# Patient Record
Sex: Female | Born: 1980 | Hispanic: No | Marital: Single | Smoking: Never smoker
Health system: Southern US, Community
[De-identification: ages and names within clinical notes are randomized; demographics above are authoritative.]

## PROBLEM LIST (undated history)

## (undated) DIAGNOSIS — F32A Depression, unspecified: Secondary | ICD-10-CM

## (undated) DIAGNOSIS — E785 Hyperlipidemia, unspecified: Secondary | ICD-10-CM

## (undated) DIAGNOSIS — N390 Urinary tract infection, site not specified: Secondary | ICD-10-CM

## (undated) DIAGNOSIS — F329 Major depressive disorder, single episode, unspecified: Secondary | ICD-10-CM

## (undated) DIAGNOSIS — T7840XA Allergy, unspecified, initial encounter: Secondary | ICD-10-CM

## (undated) HISTORY — DX: Hyperlipidemia, unspecified: E78.5

## (undated) HISTORY — DX: Depression, unspecified: F32.A

## (undated) HISTORY — PX: WISDOM TOOTH EXTRACTION: SHX21

## (undated) HISTORY — PX: EYE SURGERY: SHX253

## (undated) HISTORY — PX: REFRACTIVE SURGERY: SHX103

## (undated) HISTORY — DX: Major depressive disorder, single episode, unspecified: F32.9

## (undated) HISTORY — DX: Allergy, unspecified, initial encounter: T78.40XA

---

## 2005-08-31 ENCOUNTER — Other Ambulatory Visit: Admission: RE | Admit: 2005-08-31 | Discharge: 2005-08-31 | Payer: Self-pay | Admitting: Gynecology

## 2006-01-21 ENCOUNTER — Emergency Department (HOSPITAL_COMMUNITY): Admission: EM | Admit: 2006-01-21 | Discharge: 2006-01-21 | Payer: Self-pay | Admitting: Family Medicine

## 2006-10-03 ENCOUNTER — Other Ambulatory Visit: Admission: RE | Admit: 2006-10-03 | Discharge: 2006-10-03 | Payer: Self-pay | Admitting: Gynecology

## 2011-07-23 ENCOUNTER — Ambulatory Visit (INDEPENDENT_AMBULATORY_CARE_PROVIDER_SITE_OTHER): Payer: 59 | Admitting: Family

## 2011-07-23 ENCOUNTER — Encounter: Payer: Self-pay | Admitting: Family

## 2011-07-23 VITALS — BP 102/60 | Ht 65.0 in | Wt 142.0 lb

## 2011-07-23 DIAGNOSIS — F329 Major depressive disorder, single episode, unspecified: Secondary | ICD-10-CM

## 2011-07-23 DIAGNOSIS — J309 Allergic rhinitis, unspecified: Secondary | ICD-10-CM

## 2011-07-23 DIAGNOSIS — Z3009 Encounter for other general counseling and advice on contraception: Secondary | ICD-10-CM

## 2011-07-23 DIAGNOSIS — L309 Dermatitis, unspecified: Secondary | ICD-10-CM

## 2011-07-23 DIAGNOSIS — L259 Unspecified contact dermatitis, unspecified cause: Secondary | ICD-10-CM

## 2011-07-23 MED ORDER — TRIAMCINOLONE ACETONIDE 0.1 % EX CREA: 1.0000 "application " | TOPICAL_CREAM | Freq: Two times a day (BID) | CUTANEOUS | Status: DC

## 2011-07-23 MED ORDER — SERTRALINE HCL 50 MG PO TABS: 50.0000 mg | ORAL_TABLET | Freq: Every day | ORAL | Status: DC

## 2011-07-23 MED ORDER — TRIAMCINOLONE ACETONIDE 0.1 % EX OINT: 1.0000 "application " | TOPICAL_OINTMENT | Freq: Two times a day (BID) | CUTANEOUS | Status: DC

## 2011-07-23 MED ORDER — MOMETASONE FUROATE 50 MCG/ACT NA SUSP: 2.0000 | Freq: Every day | NASAL | Status: DC

## 2011-07-23 MED ORDER — ETONOGESTREL-ETHINYL ESTRADIOL 0.12-0.015 MG/24HR VA RING: VAGINAL_RING | VAGINAL | Status: DC

## 2011-07-23 NOTE — Patient Instructions (Signed)

## 2011-07-23 NOTE — Progress Notes (Signed)
  Subjective:    Patient ID: Brianna Mckinney, female    DOB: [birth year], 31 y.o.   MRN: [id number]  HPI 31 y/o WF, nonsmoker, new patient to the practice is in to be established. She has a history of depression, allergic rhinitic, and eczema. She is requesting a refill on her OCP. She has no concerns today. She is stable on current meds.    Review of Systems  Constitutional: Negative.   HENT: Negative.   Respiratory: Negative.   Cardiovascular: Negative.   Gastrointestinal: Negative.   Genitourinary: Negative.   Musculoskeletal: Negative.   Skin: Negative.   Neurological: Negative.   Hematological: Negative.   Psychiatric/Behavioral: Negative.    Past Medical History  Diagnosis Date  . Depression   . Allergy   . Hyperlipidemia     History   Social History  . Marital Status: Unknown    Spouse Name: N/A    Number of Children: N/A  . Years of Education: N/A   Occupational History  . Not on file.   Social History Main Topics  . Smoking status: Never Smoker   . Smokeless tobacco: Not on file  . Alcohol Use: Yes  . Drug Use: No  . Sexually Active: Not on file   Other Topics Concern  . Not on file   Social History Narrative  . No narrative on file    Past Surgical History  Procedure Date  . Wisdom tooth extraction   . Refractive surgery     Family History  Problem Relation Age of Onset  . Sudden death Mother     suicide  . Depression Mother   . Arthritis Father     RA    No Known Allergies  No current outpatient prescriptions on file prior to visit.    BP 102/60  Ht 5\' 5"  (1.651 m)  Wt 142 lb (64.411 kg)  BMI 23.63 kg/m2chart    Objective:   Physical Exam  Constitutional: She is oriented to person, place, and time. She appears well-developed and well-nourished.  HENT:  Right Ear: External ear normal.  Left Ear: External ear normal.  Nose: Nose normal.  Mouth/Throat: Oropharynx is clear and moist.  Neck: Normal range of motion. Neck supple.    Cardiovascular: Normal rate, regular rhythm and normal heart sounds.   Pulmonary/Chest: Effort normal and breath sounds normal.  Abdominal: Soft. Bowel sounds are normal.  Musculoskeletal: Normal range of motion.  Neurological: She is alert and oriented to person, place, and time.  Skin: Skin is warm and dry.  Psychiatric: She has a normal mood and affect.          Assessment & Plan:  Assessment: Counseling on Birth Control, Eczema, Depression, Allergic Rhinitis  Plan: Refill meds. Patient will return for a CPX next week. Start Zoloft 1/2 tab po qd x 1 week then increase to 1 tab. Call the office with any questions or concerns prior to next appointment.

## 2011-07-30 ENCOUNTER — Encounter: Payer: Self-pay | Admitting: Family

## 2011-07-30 ENCOUNTER — Ambulatory Visit (INDEPENDENT_AMBULATORY_CARE_PROVIDER_SITE_OTHER): Payer: 59 | Admitting: Family

## 2011-07-30 ENCOUNTER — Other Ambulatory Visit (HOSPITAL_COMMUNITY)
Admission: RE | Admit: 2011-07-30 | Discharge: 2011-07-30 | Disposition: A | Discharge status: Home / Self Care | Payer: 59 | Source: Ambulatory Visit | Attending: Family | Admitting: Family

## 2011-07-30 VITALS — BP 118/60 | Temp 99.0°F | Ht 65.0 in | Wt 141.0 lb

## 2011-07-30 DIAGNOSIS — Z0000 Encounter for general adult medical examination without abnormal findings: Secondary | ICD-10-CM

## 2011-07-30 DIAGNOSIS — Z124 Encounter for screening for malignant neoplasm of cervix: Secondary | ICD-10-CM

## 2011-07-30 DIAGNOSIS — Z01419 Encounter for gynecological examination (general) (routine) without abnormal findings: Secondary | ICD-10-CM | POA: Insufficient documentation

## 2011-07-30 LAB — BASIC METABOLIC PANEL
BUN: 13 mg/dL (ref 6–23)
CO2: 27 mEq/L (ref 19–32)
Chloride: 104 mEq/L (ref 96–112)
GFR: 97.84 mL/min (ref >60.00)
Glucose, Bld: 94 mg/dL (ref 70–99)
Potassium: 4.3 mEq/L (ref 3.5–5.1)
Sodium: 141 mEq/L (ref 135–145)

## 2011-07-30 LAB — CBC WITH DIFFERENTIAL/PLATELET
Basophils Relative: 0.5 % (ref 0.0–3.0)
Eosinophils Relative: 2.1 % (ref 0.0–5.0)
HCT: 38.2 % (ref 36.0–46.0)
Hemoglobin: 13 g/dL (ref 12.0–15.0)
Lymphs Abs: 2.8 10*3/uL (ref 0.7–4.0)
MCV: 96.8 fl (ref 78.0–100.0)
Monocytes Relative: 7.1 % (ref 3.0–12.0)
Platelets: 270 10*3/uL (ref 150.0–400.0)
RBC: 3.95 Mil/uL (ref 3.87–5.11)
WBC: 6.5 10*3/uL (ref 4.5–10.5)

## 2011-07-30 LAB — LDL CHOLESTEROL, DIRECT: Direct LDL: 137.5 mg/dL

## 2011-07-30 LAB — LIPID PANEL
HDL: 81.4 mg/dL (ref >39.00)
Total CHOL/HDL Ratio: 3
VLDL: 13.6 mg/dL (ref 0.0–40.0)

## 2011-07-30 NOTE — Progress Notes (Signed)
Subjective:    Patient ID: Brianna Mckinney, female    DOB: 1980-10-30, 31 y.o.   MRN: [id number]  HPI  This is a routine physical examination for this healthy  Female. Reviewed all health maintenance protocols  reviewed appropriate screening labs. Her immunization history was reviewed as well as her current medications and allergies refills of her chronic medications were given and the plan for yearly health maintenance was discussed all orders and referrals were made as appropriate.   Review of Systems  All other systems reviewed and are negative.   Past Medical History  Diagnosis Date  . Depression   . Allergy   . Hyperlipidemia     History   Social History  . Marital Status: Unknown    Spouse Name: N/A    Number of Children: N/A  . Years of Education: N/A   Occupational History  . Not on file.   Social History Main Topics  . Smoking status: Never Smoker   . Smokeless tobacco: Not on file  . Alcohol Use: Yes  . Drug Use: No  . Sexually Active: Not on file   Other Topics Concern  . Not on file   Social History Narrative  . No narrative on file    Past Surgical History  Procedure Date  . Wisdom tooth extraction   . Refractive surgery     Family History  Problem Relation Age of Onset  . Sudden death Mother     suicide  . Depression Mother   . Arthritis Father     RA    No Known Allergies  Current Outpatient Prescriptions on File Prior to Visit  Medication Sig Dispense Refill  . etonogestrel-ethinyl estradiol (NUVARING) 0.12-0.015 MG/24HR vaginal ring Use as directed  3 each  4  . mometasone (NASONEX) 50 MCG/ACT nasal spray Place 2 sprays into the nose daily.  17 g  3  . sertraline (ZOLOFT) 50 MG tablet Take 1 tablet (50 mg total) by mouth daily.  30 tablet  3  . triamcinolone cream (KENALOG) 0.1 % Apply 1 application topically 2 (two) times daily.  30 g  0  . triamcinolone ointment (KENALOG) 0.1 % Apply 1 application topically 2 (two) times daily.  30 g   0    BP 118/60  Temp(Src) 99 F (37.2 C) (Oral)  Ht 5\' 5"  (1.651 m)  Wt 141 lb (63.957 kg)  BMI 23.46 kg/m2chart    Objective:   Physical Exam  Constitutional: She is oriented to person, place, and time. She appears well-developed and well-nourished.  HENT:  Head: Normocephalic and atraumatic.  Right Ear: External ear normal.  Left Ear: External ear normal.  Nose: Nose normal.  Mouth/Throat: Oropharynx is clear and moist.  Eyes: Conjunctivae are normal. Pupils are equal, round, and reactive to light.  Neck: Normal range of motion.  Cardiovascular: Normal rate and normal heart sounds.   Pulmonary/Chest: Effort normal and breath sounds normal.  Abdominal: Soft. Bowel sounds are normal.  Genitourinary: Vagina normal and uterus normal.  Musculoskeletal: Normal range of motion.  Neurological: She is alert and oriented to person, place, and time. She has normal reflexes.  Skin: Skin is warm and dry.  Psychiatric: She has a normal mood and affect.          Assessment & Plan:  Assessment: Complete physical exam with Pap smear  Plan: Pap smear sent. Other labs sent to include BMP, CBC, TSH and lipids Will notify patient of the results. Encouraged him to  diet, exercise, self breast exams, hand gun safety. We'll follow the patient and the results of her labs, in one year or when necessary.

## 2011-07-30 NOTE — Patient Instructions (Signed)
Exercise to Stay Healthy Exercise helps you become and stay healthy. EXERCISE IDEAS AND TIPS Choose exercises that:  You enjoy.   Fit into your day.  You do not need to exercise really hard to be healthy. You can do exercises at a slow or medium level and stay healthy. You can:  Stretch before and after working out.   Try yoga, Pilates, or tai chi.   Lift weights.   Walk fast, swim, jog, run, climb stairs, bicycle, dance, or rollerskate.   Take aerobic classes.  Exercises that burn about 150 calories:  Running 1  miles in 15 minutes.   Playing volleyball for 45 to 60 minutes.   Washing and waxing a car for 45 to 60 minutes.   Playing touch football for 45 minutes.   Walking 1  miles in 35 minutes.   Pushing a stroller 1  miles in 30 minutes.   Playing basketball for 30 minutes.   Raking leaves for 30 minutes.   Bicycling 5 miles in 30 minutes.   Walking 2 miles in 30 minutes.   Dancing for 30 minutes.   Shoveling snow for 15 minutes.   Swimming laps for 20 minutes.   Walking up stairs for 15 minutes.   Bicycling 4 miles in 15 minutes.   Gardening for 30 to 45 minutes.   Jumping rope for 15 minutes.   Washing windows or floors for 45 to 60 minutes.  Document Released: 06/05/2010 Document Revised: 04/22/2011 Document Reviewed: 06/05/2010 [facility] Patient Information 2012 [place], [place].  Breast Self-Exam A self breast exam may help you find changes or problems while they are still small. Do a breast self-exam:  Every month.   One week after your period (menstrual period).   On the first day of each month if you do not have periods anymore.  Look for any:  Change in breast color, size, or shape.   Dimples in your breast.   Changes in your nipples or skin.   Dry skin on your breasts or nipples.   Watery or bloody discharge from your nipples.   Feel for:  Lumps.   Thick, hard places.   Any other changes.  HOME CARE There are 3 ways  to do the breast self-exam: In front of a mirror.  Lift your arms over your head and turn side to side.   Put your hands on your hips and lean down, then turn from side to side.   Bend forward and turn from side to side.  In the shower.  With soapy hands, check both breasts. Then check above and below your collarbone and your armpits.   Feel above and below your collarbone down to under your breast, and from the center of your chest to the outer edge of the armpit. Check for any lumps or hard spots.   Using the tips of your middle three fingers check your whole breast by pressing your hand over your breast in a circle or in an up and down motion.  Lying down.  Lie flat on your bed.   Put a small pillow under the breast you are going to check. On that same side, put your hand behind your head.   With your other hand, use the 3 middle fingers to feel the breast.   Move your fingers in a circle around the breast. Press firmly over all parts of the breast to feel for any lumps.  GET HELP RIGHT AWAY IF: You find any changes in  your breasts so they can be checked. Document Released: 10/20/2007 Document Revised: 04/22/2011 Document Reviewed: 08/21/2008 [facility] Patient Information 2012 [place], [place].

## 2011-12-14 ENCOUNTER — Other Ambulatory Visit: Payer: Self-pay | Admitting: Family

## 2012-01-12 ENCOUNTER — Other Ambulatory Visit: Payer: Self-pay | Admitting: Family

## 2012-02-17 ENCOUNTER — Other Ambulatory Visit: Payer: Self-pay | Admitting: Family

## 2012-04-01 ENCOUNTER — Encounter (HOSPITAL_COMMUNITY): Payer: Self-pay | Admitting: Emergency Medicine

## 2012-04-01 ENCOUNTER — Emergency Department (HOSPITAL_COMMUNITY)
Admission: EM | Admit: 2012-04-01 | Discharge: 2012-04-01 | Disposition: A | Discharge status: Home / Self Care | Payer: 59 | Source: Home / Self Care

## 2012-04-01 DIAGNOSIS — J069 Acute upper respiratory infection, unspecified: Secondary | ICD-10-CM

## 2012-04-01 DIAGNOSIS — R0982 Postnasal drip: Secondary | ICD-10-CM

## 2012-04-01 NOTE — ED Notes (Addendum)
Per information sheet prepared by patient.  Chest congestion, upper respiratory infection.  Symptoms for one week

## 2012-04-01 NOTE — ED Provider Notes (Signed)
History     CSN: [id number]  Arrival date & time 04/01/12  1144   None     No chief complaint on file.   (Consider location/radiation/quality/duration/timing/severity/associated sxs/prior treatment) HPI Comments: 31 year old pleasant female with "respiratory symptoms" for approximately one week. Started out as a cough with yellow mucus. She states that most the nasal congestion has improved and she just has postpharyngeal drainage. She denies fever, earache or sore throat. She has the sensation or perception of congestion in her upper chest.   Past Medical History  Diagnosis Date  . Depression   . Allergy   . Hyperlipidemia     Past Surgical History  Procedure Date  . Wisdom tooth extraction   . Refractive surgery     Family History  Problem Relation Age of Onset  . Sudden death Mother     suicide  . Depression Mother   . Arthritis Father     RA    History  Substance Use Topics  . Smoking status: Never Smoker   . Smokeless tobacco: Not on file  . Alcohol Use: Yes    OB History    Grav Para Term Preterm Abortions TAB SAB Ect Mult Living                  Review of Systems  Constitutional: Negative for fever, chills, activity change, appetite change and fatigue.  HENT: Positive for rhinorrhea and postnasal drip. Negative for congestion, sore throat, facial swelling, neck pain and neck stiffness.   Eyes: Negative.   Respiratory: Negative.  Negative for cough, chest tightness, shortness of breath and wheezing.   Cardiovascular: Negative.   Gastrointestinal: Negative.   Skin: Negative for pallor and rash.  Neurological: Negative.   Psychiatric/Behavioral: Negative.     Allergies  Review of patient's allergies indicates no known allergies.  Home Medications   Current Outpatient Rx  Name  Route  Sig  Dispense  Refill  . ETONOGESTREL-ETHINYL ESTRADIOL 0.12-0.015 MG/24HR VA RING      Use as directed   3 each   4   . NASONEX 50 MCG/ACT NA SUSP     INSTILL 2 SPRAYS INTO THE NOSE DAILY.   1 Inhaler   5   . SERTRALINE HCL 50 MG PO TABS      TAKE 1 TABLET BY MOUTH DAILY.   30 tablet   6   . TRIAMCINOLONE ACETONIDE 0.1 % EX CREA   Topical   Apply 1 application topically 2 (two) times daily.   30 g   0   . TRIAMCINOLONE ACETONIDE 0.1 % EX OINT   Topical   Apply 1 application topically 2 (two) times daily.   30 g   0     There were no vitals taken for this visit.  Physical Exam  Nursing note and vitals reviewed. Constitutional: She is oriented to person, place, and time. She appears well-developed and well-nourished. No distress.  HENT:       TMs are normal. Oropharynx with mild patchy erythema and streaking in the posterior oropharynx and early cobblestoning. No edema, exudates or swelling.  Eyes: EOM are normal. No scleral icterus.  Neck: Normal range of motion. Neck supple.  Cardiovascular: Normal rate and regular rhythm.   Pulmonary/Chest: Effort normal and breath sounds normal. No respiratory distress. She has no wheezes. She has no rales.  Musculoskeletal: Normal range of motion. She exhibits no edema.  Lymphadenopathy:    She has no cervical adenopathy.  Neurological: She  is alert and oriented to person, place, and time.  Skin: Skin is warm and dry. No rash noted.  Psychiatric: She has a normal mood and affect.    ED Course  Procedures (including critical care time)  Labs Reviewed - No data to display No results found.   1. URI (upper respiratory infection)   2. PND (post-nasal drip)       MDM  This is most likely the end of a URI. She still has PND which is causing her current symptoms. There are no signs of bacterial type infection, and she appears generally well. Her exam is essentially normal with exception of PND and cobblestoning in the posterior pharynx. She may continue to take her Allegra daily and if needed to increase the efficacy of the antihistamine take Chlor-Trimeton 4 mg one half  tablet  every 4 hours while at home. She may also continue to use her intranasal steroids. She was reassured that her lungs and airway are clear no adventitious sounds such as wheezing or rales. Return for any new symptoms problems or worsening 1345 the patient was still waiting for her instructions. I gave them to her verbally and since she had been waiting for so long after I had discharged her I went ahead and gave her written instructions as well so I do not think she actually signed anything before she left but she did receive her written instructions.            Hayden Rasmussen, NP 04/01/12 1317  Hayden Rasmussen, NP 04/01/12 1350  Hayden Rasmussen, NP 04/01/12 2131

## 2012-04-01 NOTE — ED Notes (Signed)
Brianna Hua np reports patient left with instructions.

## 2012-04-01 NOTE — ED Provider Notes (Signed)
Medical screening examination/treatment/procedure(s) were performed by non-physician practitioner and as supervising physician I was immediately available for consultation/collaboration.  Calani Gick, M.D.   Marinna Blane C Dajion Bickford, MD 04/01/12 2219 

## 2012-08-02 ENCOUNTER — Ambulatory Visit: Payer: 59 | Admitting: Family

## 2012-09-07 ENCOUNTER — Other Ambulatory Visit: Payer: Self-pay | Admitting: Family

## 2012-10-06 ENCOUNTER — Other Ambulatory Visit: Payer: Self-pay | Admitting: Family

## 2012-10-13 ENCOUNTER — Telehealth: Payer: Self-pay | Admitting: Family

## 2012-10-13 MED ORDER — MOMETASONE FUROATE 50 MCG/ACT NA SUSP: NASAL | Status: DC

## 2012-10-13 NOTE — Telephone Encounter (Signed)
NO FURTHER REFILLS FOR PT WITHOUT AN OFFICE VISIT

## 2012-10-13 NOTE — Telephone Encounter (Signed)
Pharmacy states they have tried to request a refill on pt's NASONEX twice this wk with no results. I see nothing in EPIC. Please send refill of NASONEX to [facility].

## 2012-10-25 ENCOUNTER — Ambulatory Visit (INDEPENDENT_AMBULATORY_CARE_PROVIDER_SITE_OTHER): Payer: 59 | Admitting: Family

## 2012-10-25 ENCOUNTER — Encounter: Payer: Self-pay | Admitting: Family

## 2012-10-25 ENCOUNTER — Other Ambulatory Visit (HOSPITAL_COMMUNITY)
Admission: RE | Admit: 2012-10-25 | Discharge: 2012-10-25 | Disposition: A | Discharge status: Home / Self Care | Payer: 59 | Source: Ambulatory Visit | Attending: Family | Admitting: Family

## 2012-10-25 VITALS — BP 94/60 | HR 80 | Ht 65.0 in | Wt 130.5 lb

## 2012-10-25 DIAGNOSIS — Z01419 Encounter for gynecological examination (general) (routine) without abnormal findings: Secondary | ICD-10-CM | POA: Insufficient documentation

## 2012-10-25 DIAGNOSIS — Z124 Encounter for screening for malignant neoplasm of cervix: Secondary | ICD-10-CM

## 2012-10-25 DIAGNOSIS — Z23 Encounter for immunization: Secondary | ICD-10-CM

## 2012-10-25 DIAGNOSIS — Z0000 Encounter for general adult medical examination without abnormal findings: Secondary | ICD-10-CM

## 2012-10-25 LAB — CBC WITH DIFFERENTIAL/PLATELET
Basophils Relative: 0.4 % (ref 0.0–3.0)
Hemoglobin: 12.9 g/dL (ref 12.0–15.0)
Lymphocytes Relative: 38.6 % (ref 12.0–46.0)
Monocytes Relative: 7.8 % (ref 3.0–12.0)
Neutro Abs: 4.4 10*3/uL (ref 1.4–7.7)
RBC: 3.87 Mil/uL (ref 3.87–5.11)
WBC: 8.8 10*3/uL (ref 4.5–10.5)

## 2012-10-25 LAB — COMPREHENSIVE METABOLIC PANEL
ALT: 15 U/L (ref 0–35)
Albumin: 4.1 g/dL (ref 3.5–5.2)
BUN: 13 mg/dL (ref 6–23)
CO2: 25 mEq/L (ref 19–32)
Calcium: 9.6 mg/dL (ref 8.4–10.5)
Chloride: 102 mEq/L (ref 96–112)
Creatinine, Ser: 0.6 mg/dL (ref 0.4–1.2)
GFR: 115.1 mL/min (ref >60.00)
Potassium: 4.1 mEq/L (ref 3.5–5.1)

## 2012-10-25 LAB — POCT URINALYSIS DIPSTICK
Bilirubin, UA: NEGATIVE
Blood, UA: NEGATIVE
Glucose, UA: NEGATIVE
Nitrite, UA: NEGATIVE
Urobilinogen, UA: 0.2

## 2012-10-25 LAB — LIPID PANEL
HDL: 88.2 mg/dL (ref >39.00)
Triglycerides: 55 mg/dL (ref 0.0–149.0)

## 2012-10-25 LAB — LDL CHOLESTEROL, DIRECT: Direct LDL: 169.1 mg/dL

## 2012-10-25 LAB — POCT URINE PREGNANCY: Preg Test, Ur: NEGATIVE

## 2012-10-25 NOTE — Progress Notes (Signed)
Subjective:    Patient ID: Brianna Mckinney, female    DOB: 1981-01-16, 32 y.o.   MRN: [id number]  HPI 32 year old white female presents to PCP for annual physical exam. Pt states she feels generally in "good health", reports a cyclic pattern of diarrhea alternating with constipation x several months with mild stomach cramping; denies any aggravating factors and states any abd pain is relieved once having a bowel movement. States LMP 10/18/12   Review of Systems  Constitutional: Negative.   HENT: Negative.   Eyes: Negative.   Respiratory: Negative.   Cardiovascular: Negative.   Gastrointestinal: Positive for diarrhea and constipation.  Endocrine: Negative.   Genitourinary: Negative.   Musculoskeletal: Negative.   Skin: Negative.   Allergic/Immunologic: Negative.   Neurological: Negative.   Hematological: Negative.   Psychiatric/Behavioral: Negative.        Past Medical History  Diagnosis Date  . Depression   . Allergy   . Hyperlipidemia     History   Social History  . Marital Status: Single    Spouse Name: N/A    Number of Children: N/A  . Years of Education: N/A   Occupational History  . Not on file.   Social History Main Topics  . Smoking status: Never Smoker   . Smokeless tobacco: Not on file  . Alcohol Use: Yes  . Drug Use: No  . Sexually Active: Yes    Birth Control/ Protection: Other-see comments   Other Topics Concern  . Not on file   Social History Narrative  . No narrative on file    Past Surgical History  Procedure Laterality Date  . Wisdom tooth extraction    . Refractive surgery      Family History  Problem Relation Age of Onset  . Sudden death Mother     suicide  . Depression Mother   . Arthritis Father     RA    No Known Allergies  Current Outpatient Prescriptions on File Prior to Visit  Medication Sig Dispense Refill  . mometasone (NASONEX) 50 MCG/ACT nasal spray INSTILL 2 SPRAYS INTO THE NOSE DAILY.  17 g  0  . NUVARING  0.12-0.015 MG/24HR vaginal ring USE AS DIRECTED  3 each  0  . sertraline (ZOLOFT) 50 MG tablet TAKE 1 TABLET BY MOUTH DAILY.  30 tablet  6  . triamcinolone cream (KENALOG) 0.1 % Apply 1 application topically 2 (two) times daily.  30 g  0  . triamcinolone ointment (KENALOG) 0.1 % Apply 1 application topically 2 (two) times daily.  30 g  0   No current facility-administered medications on file prior to visit.    BP 94/60  Pulse 80  Ht 5\' 5"  (1.651 m)  Wt 130 lb 8 oz (59.194 kg)  BMI 21.72 kg/m2  SpO2 98%chart Objective:   Physical Exam  Constitutional: She is oriented to person, place, and time. She appears well-developed and well-nourished.  HENT:  Head: Normocephalic and atraumatic.  Eyes: Pupils are equal, round, and reactive to light.  Neck: Normal range of motion.  Cardiovascular: Normal rate and regular rhythm.   Pulmonary/Chest: Effort normal.  Abdominal: Soft.  Genitourinary: Vagina normal and uterus normal.  Musculoskeletal: Normal range of motion.  Neurological: She is alert and oriented to person, place, and time.  Skin: Skin is warm and dry.          Assessment & Plan:  1.Complete Physical exam with pap and pelvic  Annual breast and pelvic/pap exam performed. Pt  educated on monthly self breast exam. Tetanus shot will be updated on this visit. Instructed to return to PCP for any acute issues.

## 2012-10-25 NOTE — Patient Instructions (Addendum)
Breast Self-Examination You should begin examining your breasts at age 34 even though the risk for breast cancer is low in this age group. It is important to become familiar with how your breasts look and feel. This is true for pregnant women, nursing mothers, women in menopause and women who have breast implants.  Women should examine their breasts once a month to look for changes and lumps. By doing monthly breast exams, you get to know how your breasts feel and how they can change from month to month. This allows you to pick up changes early. It can also offer you some reassurance that your breast health is good. This exam only takes minutes. Most breast lumps are not caused by cancer. If you find a lump, a special x-ray called a mammogram, or other tests may be needed to determine what is wrong.  Some of the signs that a breast lump is caused by cancer include:  Dimpling of the skin or changes in the shape of the breast or nipple.   A dark-colored or bloody discharge from the nipple.   Swollen lymph glands around the breast or in the armpit.   Redness of the breast or nipple.   Scaly nipple or skin on the breast.   Pain or swelling of the breast.  SELF-EXAM There are a few points to follow when doing a thorough breast exam. The best time to examine your breasts is 5 to 7 days after the menstrual period is over. During menstruation, the breasts are lumpier, and it may be more difficult to pick up changes. If you do not menstruate, have reached menopause or had a hysterectomy, examine your breasts the first day of every month. After three to four months, you will become more familiar with the variations of your breasts and more comfortable with the exam.  Perform your breast exam monthly. Keep a written record with breast changes or normal findings for each breast. This makes it easier to be sure of changes and to not solely depend on memory for size, tenderness, or location. Try to do the exam  at the same time each month, and write down where you are in your menstrual cycle if you are still menstruating.   Look at your breasts. Stand in front of a mirror with your hands clasped behind your head. Tighten your chest muscles and look for asymmetry. This means a difference in shape or contour from one breast to the other, such as puckers, dips or bumps. Look also for skin changes.   Lean forward with your hands on your hips. Again, look for symmetry and skin changes.   While showering, soap the breasts, and carefully feel the breasts with fingertips while holding the arm (on the side of the breast being examined) over the head. Do this with each breast carefully feeling for lumps or changes. Typically, a circular motion with moderate fingertip pressure should be used.   Repeat this exam while lying on your back, again with your arm behind your head and a pillow under your shoulders. Again, use your fingertips to examine both breasts, feeling for lumps and thickening. Begin at 1 o'clock and go clockwise around the whole breast.   At the end of your exam, gently squeeze each nipple to see if there is any drainage. Look for nipple changes, dimpling or redness.   Lastly, examine the upper chest and clavicle areas and in your armpits.  It is not necessary to become alarmed if you find  a breast lump. Most of them are not cancerous. However, it is necessary to see your caregiver if a lump is found in order to have it looked at. Document Released: 06/10/2004 Document Revised: 01/13/2011 Document Reviewed: 08/20/2008 [facility] Patient Information 2012 [place], [place].  Irritable Bowel Syndrome Irritable Bowel Syndrome (IBS) is caused by a disturbance of normal bowel function. Other terms used are spastic colon, mucous colitis, and irritable colon. It does not require surgery, nor does it lead to cancer. There is no cure for IBS. But with proper diet, stress reduction, and medication, you will find  that your problems (symptoms) will gradually disappear or improve. IBS is a common digestive disorder. It usually appears in late adolescence or early adulthood. Women develop it twice as often as men. CAUSES  After food has been digested and absorbed in the small intestine, waste material is moved into the colon (large intestine). In the colon, water and salts are absorbed from the undigested products coming from the small intestine. The remaining residue, or fecal material, is held for elimination. Under normal circumstances, gentle, rhythmic contractions on the bowel walls push the fecal material along the colon towards the rectum. In IBS, however, these contractions are irregular and poorly coordinated. The fecal material is either retained too long, resulting in constipation, or expelled too soon, producing diarrhea. SYMPTOMS  The most common symptom of IBS is pain. It is typically in the lower left side of the belly (abdomen). But it may occur anywhere in the abdomen. It can be felt as heartburn, backache, or even as a dull pain in the arms or shoulders. The pain comes from excessive bowel-muscle spasms and from the buildup of gas and fecal material in the colon. This pain:  Can range from sharp belly (abdominal) cramps to a dull, continuous ache.  Usually worsens soon after eating.  Is typically relieved by having a bowel movement or passing gas. Abdominal pain is usually accompanied by constipation. But it may also produce diarrhea. The diarrhea typically occurs right after a meal or upon arising in the morning. The stools are typically soft and watery. They are often flecked with secretions (mucus). Other symptoms of IBS include:  Bloating.  Loss of appetite.  Heartburn.  Feeling sick to your stomach (nausea).  Belching  Vomiting  Gas. IBS may also cause a number of symptoms that are unrelated to the digestive system:  Fatigue.  Headaches.  Anxiety  Shortness of  breath  Difficulty in concentrating.  Dizziness. These symptoms tend to come and go. DIAGNOSIS  The symptoms of IBS closely mimic the symptoms of other, more serious digestive disorders. So your caregiver may wish to perform a variety of additional tests to exclude these disorders. He/she wants to be certain of learning what is wrong (diagnosis). The nature and purpose of each test will be explained to you. TREATMENT A number of medications are available to help correct bowel function and/or relieve bowel spasms and abdominal pain. Among the drugs available are:  Mild, non-irritating laxatives for severe constipation and to help restore normal bowel habits.  Specific anti-diarrheal medications to treat severe or prolonged diarrhea.  Anti-spasmodic agents to relieve intestinal cramps.  Your caregiver may also decide to treat you with a mild tranquilizer or sedative during unusually stressful periods in your life. The important thing to remember is that if any drug is prescribed for you, make sure that you take it exactly as directed. Make sure that your caregiver knows how well it worked  for you. HOME CARE INSTRUCTIONS   Avoid foods that are high in fat or oils. Some examples [id number]:[id number] cream, butter, frankfurters, sausage, and other fatty meats.  Avoid foods that have a laxative effect, such as fruit, fruit juice, and dairy products.  Cut out carbonated drinks, chewing gum, and "gassy" foods, such as beans and cabbage. This may help relieve bloating and belching.  Bran taken with plenty of liquids may help relieve constipation.  Keep track of what foods seem to trigger your symptoms.  Avoid emotionally charged situations or circumstances that produce anxiety.  Start or continue exercising.  Get plenty of rest and sleep. MAKE SURE YOU:   Understand these instructions.  Will watch your condition.  Will get help right away if you are not doing well or get worse. Document  Released: 05/03/2005 Document Revised: 07/26/2011 Document Reviewed: 12/22/2007 [facility] Patient Information 2014 [place], [place].

## 2012-10-25 NOTE — Addendum Note (Signed)
Addended by: Beverely Low on: 10/25/2012 10:19 AM   Modules accepted: Orders

## 2012-10-26 ENCOUNTER — Other Ambulatory Visit: Payer: Self-pay | Admitting: Family

## 2012-10-26 MED ORDER — ATORVASTATIN CALCIUM 10 MG PO TABS: 10.0000 mg | ORAL_TABLET | Freq: Every day | ORAL | Status: DC

## 2012-10-27 ENCOUNTER — Other Ambulatory Visit: Payer: Self-pay

## 2012-10-27 MED ORDER — TRIAMCINOLONE ACETONIDE 0.1 % EX CREA: 1.0000 "application " | TOPICAL_CREAM | Freq: Two times a day (BID) | CUTANEOUS | Status: DC

## 2012-10-27 MED ORDER — TRIAMCINOLONE ACETONIDE 0.1 % EX OINT: 1.0000 "application " | TOPICAL_OINTMENT | Freq: Two times a day (BID) | CUTANEOUS | Status: DC

## 2012-10-27 MED ORDER — ETONOGESTREL-ETHINYL ESTRADIOL 0.12-0.015 MG/24HR VA RING: VAGINAL_RING | VAGINAL | Status: DC

## 2012-10-27 MED ORDER — MOMETASONE FUROATE 50 MCG/ACT NA SUSP: NASAL | Status: DC

## 2012-12-02 ENCOUNTER — Emergency Department (HOSPITAL_COMMUNITY)
Admission: EM | Admit: 2012-12-02 | Discharge: 2012-12-02 | Disposition: A | Discharge status: Home / Self Care | Payer: 59 | Source: Home / Self Care | Attending: Emergency Medicine | Admitting: Emergency Medicine

## 2012-12-02 ENCOUNTER — Encounter (HOSPITAL_COMMUNITY): Payer: Self-pay | Admitting: Emergency Medicine

## 2012-12-02 DIAGNOSIS — N3000 Acute cystitis without hematuria: Secondary | ICD-10-CM

## 2012-12-02 LAB — POCT URINALYSIS DIP (DEVICE)
Bilirubin Urine: NEGATIVE
Glucose, UA: NEGATIVE mg/dL
Ketones, ur: NEGATIVE mg/dL
pH: 6 (ref 5.0–8.0)

## 2012-12-02 LAB — POCT PREGNANCY, URINE: Preg Test, Ur: NEGATIVE

## 2012-12-02 MED ORDER — CEPHALEXIN 500 MG PO CAPS: 500.0000 mg | ORAL_CAPSULE | Freq: Three times a day (TID) | ORAL | Status: DC

## 2012-12-02 MED ORDER — PHENAZOPYRIDINE HCL 200 MG PO TABS: 200.0000 mg | ORAL_TABLET | Freq: Three times a day (TID) | ORAL | Status: DC | PRN

## 2012-12-02 NOTE — ED Notes (Signed)
Pt reports uti sx that started last night.  Urinary urgency, frequency, bladder spasms.  hematuria, and dysuria. Strong urine odor.  Pt has not used any otc meds for treatment.

## 2012-12-02 NOTE — ED Provider Notes (Signed)
Chief Complaint:   Chief Complaint  Patient presents with  . Urinary Tract Infection    onset last night. hematuria. bladder spasms. urgency frequency and odor.     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 She describes bladder spasms, urinary frequency, urgency, pain with urination, blood in the urine, urinary odor, and suprapubic pain. She denies fever, chills, nausea, vomiting, lower back pain, or GYN symptoms she had a urinary tract infection 3 years ago with similar symptoms.  Review of Systems:  Other than noted above, the patient denies any of the following symptoms: General:  No fevers, chills, sweats, aches, or fatigue. GI:  No abdominal pain, back pain, nausea, vomiting, diarrhea, or constipation. GU:  No dysuria, frequency, urgency, hematuria, or incontinence. GYN:  No discharge, itching, vulvar pain or lesions, pelvic pain, or abnormal vaginal bleeding.  PMFSH:  Past medical history, family history, social history, meds, and allergies were reviewed.  She takes Lipitor for elevated cholesterol, sertraline for depression and has a history of allergies. She has a NuvaRing in place.  Physical Exam:   Vital signs:  BP 126/78  Pulse 65  Resp 14  SpO2 100%  LMP 11/15/2012 Gen:  Alert, oriented, in no distress. Lungs:  Clear to auscultation, no wheezes, rales or rhonchi. Heart:  Regular rhythm, no gallop or murmer. Abdomen:  Flat and soft. There was slight suprapubic pain to palpation.  No guarding, or rebound.  No hepato-splenomegaly or mass.  Bowel sounds were normally active. Back:  No CVA tenderness.  Skin:  Clear, warm and dry.  Labs:    Results for orders placed during the hospital encounter of 12/02/12  POCT URINALYSIS DIP (DEVICE)      Result Value Range   Glucose, UA NEGATIVE  NEGATIVE mg/dL   Bilirubin Urine NEGATIVE  NEGATIVE   Ketones, ur NEGATIVE  NEGATIVE mg/dL   Specific  Gravity, Urine >=1.030  1.005 - 1.030   Hgb urine dipstick LARGE (*) NEGATIVE   pH 6.0  5.0 - 8.0   Protein, ur 100 (*) NEGATIVE mg/dL   Urobilinogen, UA 1.0  0.0 - 1.0 mg/dL   Nitrite NEGATIVE  NEGATIVE   Leukocytes, UA MODERATE (*) NEGATIVE  POCT PREGNANCY, URINE      Result Value Range   Preg Test, Ur NEGATIVE  NEGATIVE     A urine culture was obtained.  Results are pending at this time and we will call about any positive results.  Assessment: The encounter diagnosis was Acute cystitis.   No evidence of pyelonephritis.  Plan:   1.  The following meds were prescribed:   Discharge Medication List as of 12/02/2012  6:38 PM    START taking these medications   Details  cephALEXin (KEFLEX) 500 MG capsule Take 1 capsule (500 mg total) by mouth 3 (three) times daily., Starting 12/02/2012, Until Discontinued, Normal    phenazopyridine (PYRIDIUM) 200 MG tablet Take 1 tablet (200 mg total) by mouth 3 (three) times daily as needed for pain., Starting 12/02/2012, Until Discontinued, Normal       2.  The patient was instructed in symptomatic care and handouts were given. 3.  The patient was told to return if becoming worse in any way, if no better in 3 or 4 days, and given some red flag symptoms such as fever, vomiting, or increasing pain that would indicate earlier return. 4.  The patient was told to  avoid intercourse for 10 days, get extra fluids, and return for a follow up with her primary care doctor at the completion of treatment for a repeat UA and culture.     Reuben Likes, MD 12/02/12 [phone]

## 2012-12-05 LAB — URINE CULTURE

## 2012-12-05 NOTE — ED Notes (Signed)
Urine culture: >100,000 colonies E. Coli.   Pt. adequately treated with Keflex. Brianna Mckinney M 12/05/2012  

## 2012-12-22 ENCOUNTER — Telehealth: Payer: Self-pay | Admitting: Family

## 2012-12-22 ENCOUNTER — Ambulatory Visit (INDEPENDENT_AMBULATORY_CARE_PROVIDER_SITE_OTHER): Payer: 59 | Admitting: Family Medicine

## 2012-12-22 VITALS — BP 96/64 | Temp 98.2°F | Wt 126.0 lb

## 2012-12-22 DIAGNOSIS — N390 Urinary tract infection, site not specified: Secondary | ICD-10-CM

## 2012-12-22 DIAGNOSIS — R300 Dysuria: Secondary | ICD-10-CM

## 2012-12-22 LAB — POCT URINALYSIS DIPSTICK
Glucose, UA: NEGATIVE
Ketones, UA: NEGATIVE
Spec Grav, UA: 1.025

## 2012-12-22 MED ORDER — CIPROFLOXACIN HCL 500 MG PO TABS: 500.0000 mg | ORAL_TABLET | Freq: Two times a day (BID) | ORAL | Status: DC

## 2012-12-22 NOTE — Telephone Encounter (Signed)
Rx sent to pharmacy   

## 2012-12-22 NOTE — Progress Notes (Signed)
Chief Complaint  Patient presents with  . Dysuria    HPI:  Brianna Mckinney, a 32 yo F pt of [facility]ango is here for an acute visit for dysuria, ? UTI: -had UTI confirmed on culture a few weeks ago tx with keflex - felt better then started having dysuria last night -symptoms: dysuria, spasm with urinary, frequency, urgency -denies: fevers, vomiting, nausea, malaise, hematuria, flank pain - very mild low back pain -hx of several UTIs in the past -FDLMP 1 week ago ROS: See pertinent positives and negatives per HPI.  Past Medical History  Diagnosis Date  . Depression   . Allergy   . Hyperlipidemia     Family History  Problem Relation Age of Onset  . Sudden death Mother     suicide  . Depression Mother   . Arthritis Father     RA    History   Social History  . Marital Status: Single    Spouse Name: N/A    Number of Children: N/A  . Years of Education: N/A   Social History Main Topics  . Smoking status: Never Smoker   . Smokeless tobacco: Not on file  . Alcohol Use: Yes  . Drug Use: No  . Sexually Active: Yes    Birth Control/ Protection: Other-see comments   Other Topics Concern  . Not on file   Social History Narrative  . No narrative on file    Current outpatient prescriptions:atorvastatin (LIPITOR) 10 MG tablet, Take 1 tablet (10 mg total) by mouth daily., Disp: 30 tablet, Rfl: 3;  etonogestrel-ethinyl estradiol (NUVARING) 0.12-0.015 MG/24HR vaginal ring, USE AS DIRECTED, Disp: 3 each, Rfl: 6;  mometasone (NASONEX) 50 MCG/ACT nasal spray, INSTILL 2 SPRAYS INTO THE NOSE DAILY., Disp: 17 g, Rfl: 6;  sertraline (ZOLOFT) 50 MG tablet, TAKE 1 TABLET BY MOUTH DAILY., Disp: 30 tablet, Rfl: 6 triamcinolone cream (KENALOG) 0.1 %, Apply 1 application topically 2 (two) times daily., Disp: 30 g, Rfl: 0;  triamcinolone ointment (KENALOG) 0.1 %, Apply 1 application topically 2 (two) times daily., Disp: 30 g, Rfl: 0;  cephALEXin (KEFLEX) 500 MG capsule, Take 1 capsule (500 mg  total) by mouth 3 (three) times daily., Disp: 30 capsule, Rfl: 0 ciprofloxacin (CIPRO) 500 MG tablet, Take 1 tablet (500 mg total) by mouth 2 (two) times daily., Disp: 10 tablet, Rfl: 0;  phenazopyridine (PYRIDIUM) 200 MG tablet, Take 1 tablet (200 mg total) by mouth 3 (three) times daily as needed for pain., Disp: 15 tablet, Rfl: 0  EXAM:  Filed Vitals:   12/22/12 1440  BP: 96/64  Temp: 98.2 F (36.8 C)    Body mass index is 20.97 kg/(m^2).  GENERAL: vitals reviewed and listed above, alert, oriented, appears well hydrated and in no acute distress  HEENT: atraumatic, conjunttiva clear, no obvious abnormalities on inspection of external nose and ears  NECK: no obvious masses on inspection  LUNGS: clear to auscultation bilaterally, no wheezes, rales or rhonchi, good air movement  CV: HRRR, no peripheral edema  ABD: soft, NTTP, no CVA TTP  MS: moves all extremities without noticeable abnormality  PSYCH: pleasant and cooperative, no obvious depression or anxiety  ASSESSMENT AND PLAN:  Discussed the following assessment and plan:  Dysuria - Plan: POCT urinalysis dipstick, ciprofloxacin (CIPRO) 500 MG tablet  UTI (urinary tract infection) - Plan: ciprofloxacin (CIPRO) 500 MG tablet  -urine dip and symptoms c/w uti -cipro empirically - discussed risks and return precautions -culture pending -discussed prevention strategies -Patient advised to  return or notify a doctor immediately if symptoms worsen or persist or new concerns arise   There are no Patient Instructions on file for this visit.   Kriste Basque R.

## 2012-12-22 NOTE — Telephone Encounter (Signed)
Pt needs her RX for ciprofloxacin (CIPRO) 500 MG tablet to be called in to [facility]. It was called into [facility], and she did not want it there. Can you pls resend.

## 2012-12-25 LAB — URINE CULTURE: Colony Count: >=100000

## 2013-03-22 ENCOUNTER — Other Ambulatory Visit: Payer: Self-pay

## 2013-03-29 ENCOUNTER — Telehealth: Payer: Self-pay | Admitting: Family

## 2013-03-29 NOTE — Telephone Encounter (Signed)
PT is on chole med and needs lipid panel. Can I sch?

## 2013-03-30 ENCOUNTER — Other Ambulatory Visit: Payer: Self-pay

## 2013-03-30 ENCOUNTER — Other Ambulatory Visit (INDEPENDENT_AMBULATORY_CARE_PROVIDER_SITE_OTHER): Payer: 59

## 2013-03-30 DIAGNOSIS — E7800 Pure hypercholesterolemia, unspecified: Secondary | ICD-10-CM

## 2013-03-30 LAB — LIPID PANEL
Cholesterol: 231 mg/dL — ABNORMAL HIGH (ref 0–200)
Total CHOL/HDL Ratio: 3
Triglycerides: 88 mg/dL (ref 0.0–149.0)

## 2013-03-30 LAB — HEPATIC FUNCTION PANEL
ALT: 12 U/L (ref 0–35)
AST: 14 U/L (ref 0–37)
Albumin: 3.9 g/dL (ref 3.5–5.2)
Alkaline Phosphatase: 41 U/L (ref 39–117)

## 2013-03-30 MED ORDER — ATORVASTATIN CALCIUM 10 MG PO TABS: 10.0000 mg | ORAL_TABLET | Freq: Every day | ORAL | Status: DC

## 2013-03-30 NOTE — Telephone Encounter (Signed)
lmom for pt to call back

## 2013-03-30 NOTE — Telephone Encounter (Signed)
Ok to schedule.

## 2013-03-30 NOTE — Telephone Encounter (Signed)
Pt has been sch

## 2013-04-02 LAB — LDL CHOLESTEROL, DIRECT: Direct LDL: 135.3 mg/dL

## 2013-05-22 ENCOUNTER — Other Ambulatory Visit: Payer: Self-pay | Admitting: Family

## 2013-08-13 ENCOUNTER — Telehealth: Payer: Self-pay | Admitting: Family

## 2013-08-13 MED ORDER — ATORVASTATIN CALCIUM 10 MG PO TABS: 10.0000 mg | ORAL_TABLET | Freq: Every day | ORAL | Status: DC

## 2013-08-13 NOTE — Telephone Encounter (Signed)
Done

## 2013-08-13 NOTE — Telephone Encounter (Signed)
Pt needs refill on generic lipitor call into [facility]. Pt last blood work was in nov 2014

## 2013-08-15 ENCOUNTER — Telehealth: Payer: Self-pay | Admitting: Family

## 2013-08-15 MED ORDER — MOMETASONE FUROATE 50 MCG/ACT NA SUSP: NASAL | Status: DC

## 2013-08-15 NOTE — Telephone Encounter (Signed)
[facility] - [place], [place] - [address] Requesting re-fill on mometasone (NASONEX) 50 MCG/ACT nasal spray

## 2013-08-15 NOTE — Telephone Encounter (Signed)
Rx sent 

## 2013-11-20 ENCOUNTER — Other Ambulatory Visit: Payer: Self-pay | Admitting: Family

## 2014-01-09 ENCOUNTER — Encounter: Payer: Self-pay | Admitting: Family

## 2014-01-09 ENCOUNTER — Ambulatory Visit (INDEPENDENT_AMBULATORY_CARE_PROVIDER_SITE_OTHER): Payer: 59 | Admitting: Family

## 2014-01-09 VITALS — BP 98/62 | HR 74 | Wt 126.0 lb

## 2014-01-09 DIAGNOSIS — J309 Allergic rhinitis, unspecified: Secondary | ICD-10-CM

## 2014-01-09 DIAGNOSIS — L259 Unspecified contact dermatitis, unspecified cause: Secondary | ICD-10-CM

## 2014-01-09 DIAGNOSIS — E7800 Pure hypercholesterolemia, unspecified: Secondary | ICD-10-CM

## 2014-01-09 DIAGNOSIS — F32A Depression, unspecified: Secondary | ICD-10-CM

## 2014-01-09 DIAGNOSIS — F329 Major depressive disorder, single episode, unspecified: Secondary | ICD-10-CM

## 2014-01-09 DIAGNOSIS — L309 Dermatitis, unspecified: Secondary | ICD-10-CM

## 2014-01-09 DIAGNOSIS — F3289 Other specified depressive episodes: Secondary | ICD-10-CM

## 2014-01-09 LAB — CBC WITH DIFFERENTIAL/PLATELET
BASOS PCT: 0.5 % (ref 0.0–3.0)
Basophils Absolute: 0 10*3/uL (ref 0.0–0.1)
EOS ABS: 0.2 10*3/uL (ref 0.0–0.7)
Eosinophils Relative: 2.1 % (ref 0.0–5.0)
HCT: 37.6 % (ref 36.0–46.0)
Hemoglobin: 12.6 g/dL (ref 12.0–15.0)
LYMPHS ABS: 2.4 10*3/uL (ref 0.7–4.0)
Lymphocytes Relative: 33.6 % (ref 12.0–46.0)
MCHC: 33.4 g/dL (ref 30.0–36.0)
MCV: 99.4 fl (ref 78.0–100.0)
MONO ABS: 0.6 10*3/uL (ref 0.1–1.0)
Monocytes Relative: 8.4 % (ref 3.0–12.0)
NEUTROS ABS: 4 10*3/uL (ref 1.4–7.7)
NEUTROS PCT: 55.4 % (ref 43.0–77.0)
Platelets: 237 10*3/uL (ref 150.0–400.0)
RBC: 3.78 Mil/uL — AB (ref 3.87–5.11)
RDW: 13 % (ref 11.5–15.5)
WBC: 7.3 10*3/uL (ref 4.0–10.5)

## 2014-01-09 LAB — LIPID PANEL
CHOLESTEROL: 220 mg/dL — AB (ref 0–200)
HDL: 78.5 mg/dL (ref >39.00)
LDL CALC: 129 mg/dL — AB (ref 0–99)
NonHDL: 141.5
TRIGLYCERIDES: 65 mg/dL (ref 0.0–149.0)
Total CHOL/HDL Ratio: 3
VLDL: 13 mg/dL (ref 0.0–40.0)

## 2014-01-09 LAB — TSH: TSH: 0.78 u[IU]/mL (ref 0.35–4.50)

## 2014-01-09 LAB — POCT URINALYSIS DIPSTICK
BILIRUBIN UA: NEGATIVE
GLUCOSE UA: NEGATIVE
KETONES UA: NEGATIVE
LEUKOCYTES UA: NEGATIVE
NITRITE UA: NEGATIVE
Protein, UA: NEGATIVE
Spec Grav, UA: 1.02
Urobilinogen, UA: 0.2
pH, UA: 5.5

## 2014-01-09 LAB — COMPREHENSIVE METABOLIC PANEL
ALK PHOS: 42 U/L (ref 39–117)
ALT: 12 U/L (ref 0–35)
AST: 16 U/L (ref 0–37)
Albumin: 4.1 g/dL (ref 3.5–5.2)
BILIRUBIN TOTAL: 0.8 mg/dL (ref 0.2–1.2)
BUN: 17 mg/dL (ref 6–23)
CO2: 24 mEq/L (ref 19–32)
CREATININE: 0.6 mg/dL (ref 0.4–1.2)
Calcium: 9.4 mg/dL (ref 8.4–10.5)
Chloride: 105 mEq/L (ref 96–112)
GFR: 116.41 mL/min (ref >60.00)
GLUCOSE: 84 mg/dL (ref 70–99)
Potassium: 4.3 mEq/L (ref 3.5–5.1)
SODIUM: 138 meq/L (ref 135–145)
Total Protein: 7.6 g/dL (ref 6.0–8.3)

## 2014-01-09 MED ORDER — MOMETASONE FUROATE 50 MCG/ACT NA SUSP: NASAL | Status: DC

## 2014-01-09 MED ORDER — SERTRALINE HCL 50 MG PO TABS: ORAL_TABLET | ORAL | Status: AC

## 2014-01-09 MED ORDER — ETONOGESTREL-ETHINYL ESTRADIOL 0.12-0.015 MG/24HR VA RING: VAGINAL_RING | VAGINAL | Status: DC

## 2014-01-09 MED ORDER — ATORVASTATIN CALCIUM 10 MG PO TABS: 10.0000 mg | ORAL_TABLET | Freq: Every day | ORAL | Status: DC

## 2014-01-09 MED ORDER — TRIAMCINOLONE ACETONIDE 0.1 % EX OINT: 1.0000 "application " | TOPICAL_OINTMENT | Freq: Two times a day (BID) | CUTANEOUS | Status: DC

## 2014-01-09 MED ORDER — TRIAMCINOLONE ACETONIDE 0.1 % EX CREA: 1.0000 "application " | TOPICAL_CREAM | Freq: Two times a day (BID) | CUTANEOUS | Status: AC

## 2014-01-09 NOTE — Patient Instructions (Signed)
Cardiac Diet This diet can help prevent heart disease and stroke. Many factors influence your heart health, including eating and exercise habits. Coronary risk rises a lot with abnormal blood fat (lipid) levels. Cardiac meal planning includes limiting unhealthy fats, increasing healthy fats, and making other small dietary changes. General guidelines are as follows:  Adjust calorie intake to reach and maintain desirable body weight.  Limit total fat intake to less than 30% of total calories. Saturated fat should be less than 7% of calories.  Saturated fats are found in animal products and in some vegetable products. Saturated vegetable fats are found in coconut oil, cocoa butter, palm oil, and palm kernel oil. Read labels carefully to avoid these products as much as possible. Use butter in moderation. Choose tub margarines and oils that have 2 grams of fat or less. Good cooking oils are canola and olive oils.  Practice low-fat cooking techniques. Do not fry food. Instead, broil, bake, boil, steam, grill, roast on a rack, stir-fry, or microwave it. Other fat reducing suggestions include:  Remove the skin from poultry.  Remove all visible fat from meats.  Skim the fat off stews, soups, and gravies before serving them.  Steam vegetables in water or broth instead of sauting them in fat.  Avoid foods with trans fat (or hydrogenated oils), such as commercially fried foods and commercially baked goods. Commercial shortening and deep-frying fats will contain trans fat.  Increase intake of fruits, vegetables, whole grains, and legumes to replace foods high in fat.  Increase consumption of nuts, legumes, and seeds to at least 4 servings weekly. One serving of a legume equals  cup, and 1 serving of nuts or seeds equals  cup.  Choose whole grains more often. Have 3 servings per day (a serving is 1 ounce [oz]).  Eat 4 to 5 servings of vegetables per day. A serving of vegetables is 1 cup of raw leafy  vegetables;  cup of raw or cooked cut-up vegetables;  cup of vegetable juice.  Eat 4 to 5 servings of fruit per day. A serving of fruit is 1 medium whole fruit;  cup of dried fruit;  cup of fresh, frozen, or canned fruit;  cup of 100% fruit juice.  Increase your intake of dietary fiber to 20 to 30 grams per day. Insoluble fiber may help lower your risk of heart disease and may help curb your appetite.  Soluble fiber binds cholesterol to be removed from the blood. Foods high in soluble fiber are dried beans, citrus fruits, oats, apples, bananas, broccoli, Brussels sprouts, and eggplant.  Try to include foods fortified with plant sterols or stanols, such as yogurt, breads, juices, or margarines. Choose several fortified foods to achieve a daily intake of 2 to 3 grams of plant sterols or stanols.  Foods with omega-3 fats can help reduce your risk of heart disease. Aim to have a 3.5 oz portion of fatty fish twice per week, such as salmon, mackerel, albacore tuna, sardines, lake trout, or herring. If you wish to take a fish oil supplement, choose one that contains 1 gram of both DHA and EPA.  Limit processed meats to 2 servings (3 oz portion) weekly.  Limit the sodium in your diet to 1500 milligrams (mg) per day. If you have high blood pressure, talk to a registered dietitian about a DASH (Dietary Approaches to Stop Hypertension) eating plan.  Limit sweets and beverages with added sugar, such as soda, to no more than 5 servings per week. One   serving is:   1 tablespoon sugar.  1 tablespoon jelly or jam.   cup sorbet.  1 cup lemonade.   cup regular soda. CHOOSING FOODS Starches  Allowed: Breads: All kinds (wheat, rye, raisin, white, oatmeal, Italian, French, and English muffin bread). Low-fat rolls: English muffins, frankfurter and hamburger buns, bagels, pita bread, tortillas (not fried). Pancakes, waffles, biscuits, and muffins made with recommended oil.  Avoid: Products made with  saturated or trans fats, oils, or whole milk products. Butter rolls, cheese breads, croissants. Commercial doughnuts, muffins, sweet rolls, biscuits, waffles, pancakes, store-bought mixes. Crackers  Allowed: Low-fat crackers and snacks: Animal, graham, rye, saltine (with recommended oil, no lard), oyster, and matzo crackers. Bread sticks, melba toast, rusks, flatbread, pretzels, and light popcorn.  Avoid: High-fat crackers: cheese crackers, butter crackers, and those made with coconut, palm oil, or trans fat (hydrogenated oils). Buttered popcorn. Cereals  Allowed: Hot or cold whole-grain cereals.  Avoid: Cereals containing coconut, hydrogenated vegetable fat, or animal fat. Potatoes / Pasta / Rice  Allowed: All kinds of potatoes, rice, and pasta (such as macaroni, spaghetti, and noodles).  Avoid: Pasta or rice prepared with cream sauce or high-fat cheese. Chow mein noodles, French fries. Vegetables  Allowed: All vegetables and vegetable juices.  Avoid: Fried vegetables. Vegetables in cream, butter, or high-fat cheese sauces. Limit coconut. Fruit in cream or custard. Protein  Allowed: Limit your intake of meat, seafood, and poultry to no more than 6 oz (cooked weight) per day. All lean, well-trimmed beef, veal, pork, and lamb. All chicken and turkey without skin. All fish and shellfish. Wild game: wild duck, rabbit, pheasant, and venison. Egg whites or low-cholesterol egg substitutes may be used as desired. Meatless dishes: recipes with dried beans, peas, lentils, and tofu (soybean curd). Seeds and nuts: all seeds and most nuts.  Avoid: Prime grade and other heavily marbled and fatty meats, such as short ribs, spare ribs, rib eye roast or steak, frankfurters, sausage, bacon, and high-fat luncheon meats, mutton. Caviar. Commercially fried fish. Domestic duck, goose, venison sausage. Organ meats: liver, gizzard, heart, chitterlings, brains, kidney, sweetbreads. Dairy  Allowed: Low-fat  cheeses: nonfat or low-fat cottage cheese (1% or 2% fat), cheeses made with part skim milk, such as mozzarella, farmers, string, or ricotta. (Cheeses should be labeled no more than 2 to 6 grams fat per oz.). Skim (or 1%) milk: liquid, powdered, or evaporated. Buttermilk made with low-fat milk. Drinks made with skim or low-fat milk or cocoa. Chocolate milk or cocoa made with skim or low-fat (1%) milk. Nonfat or low-fat yogurt.  Avoid: Whole milk cheeses, including colby, cheddar, muenster, Monterey Jack, Havarti, Brie, Camembert, American, Swiss, and blue. Creamed cottage cheese, cream cheese. Whole milk and whole milk products, including buttermilk or yogurt made from whole milk, drinks made from whole milk. Condensed milk, evaporated whole milk, and 2% milk. Soups and Combination Foods  Allowed: Low-fat low-sodium soups: broth, dehydrated soups, homemade broth, soups with the fat removed, homemade cream soups made with skim or low-fat milk. Low-fat spaghetti, lasagna, chili, and Spanish rice if low-fat ingredients and low-fat cooking techniques are used.  Avoid: Cream soups made with whole milk, cream, or high-fat cheese. All other soups. Desserts and Sweets  Allowed: Sherbet, fruit ices, gelatins, meringues, and angel food cake. Homemade desserts with recommended fats, oils, and milk products. Jam, jelly, honey, marmalade, sugars, and syrups. Pure sugar candy, such as gum drops, hard candy, jelly beans, marshmallows, mints, and small amounts of dark chocolate.  Avoid: Commercially prepared   cakes, pies, cookies, frosting, pudding, or mixes for these products. Desserts containing whole milk products, chocolate, coconut, lard, palm oil, or palm kernel oil. Ice cream or ice cream drinks. Candy that contains chocolate, coconut, butter, hydrogenated fat, or unknown ingredients. Buttered syrups. Fats and Oils  Allowed: Vegetable oils: safflower, sunflower, corn, soybean, cottonseed, sesame, canola, olive,  or peanut. Non-hydrogenated margarines. Salad dressing or mayonnaise: homemade or commercial, made with a recommended oil. Low or nonfat salad dressing or mayonnaise.  Limit added fats and oils to 6 to 8 tsp per day (includes fats used in cooking, baking, salads, and spreads on bread). Remember to count the "hidden fats" in foods.  Avoid: Solid fats and shortenings: butter, lard, salt pork, bacon drippings. Gravy containing meat fat, shortening, or suet. Cocoa butter, coconut. Coconut oil, palm oil, palm kernel oil, or hydrogenated oils: these ingredients are often used in bakery products, nondairy creamers, whipped toppings, candy, and commercially fried foods. Read labels carefully. Salad dressings made of unknown oils, sour cream, or cheese, such as blue cheese and Roquefort. Cream, all kinds: half-and-half, light, heavy, or whipping. Sour cream or cream cheese (even if "light" or low-fat). Nondairy cream substitutes: coffee creamers and sour cream substitutes made with palm, palm kernel, hydrogenated oils, or coconut oil. Beverages  Allowed: Coffee (regular or decaffeinated), tea. Diet carbonated beverages, mineral water. Alcohol: Check with your caregiver. Moderation is recommended.  Avoid: Whole milk, regular sodas, and juice drinks with added sugar. Condiments  Allowed: All seasonings and condiments. Cocoa powder. "Cream" sauces made with recommended ingredients.  Avoid: Carob powder made with hydrogenated fats. SAMPLE MENU Breakfast   cup orange juice   cup oatmeal  1 slice toast  1 tsp margarine  1 cup skim milk Lunch  Turkey sandwich with 2 oz turkey, 2 slices bread  Lettuce and tomato slices  Fresh fruit  Carrot sticks  Coffee or tea Snack  Fresh fruit or low-fat crackers Dinner  3 oz lean ground beef  1 baked potato  1 tsp margarine   cup asparagus  Lettuce salad  1 tbs non-creamy dressing   cup peach slices  1 cup skim milk Document Released:  02/10/2008 Document Revised: 11/02/2011 Document Reviewed: 07/03/2013 [employer] Patient Information 2015 [employer]. This information is not intended to replace advice given to you by your health care provider. Make sure you discuss any questions you have with your health care provider.  

## 2014-01-09 NOTE — Progress Notes (Signed)
Pre visit review using our clinic review tool, if applicable. No additional management support is needed unless otherwise documented below in the visit note. 

## 2014-01-09 NOTE — Progress Notes (Signed)
Subjective:    Patient ID: Brianna Mckinney, female    DOB: 1981/01/29, 33 y.o.   MRN: [id number]  HPI 33 year old white female, nonsmoker with a history of allergic rhinitis, depression, eczema, and hypercholesterolemia is in today for recheck. Denies any concerns today. Reports he is stable.   Review of Systems  Constitutional: Negative.   HENT: Negative.   Respiratory: Negative.   Cardiovascular: Negative.   Gastrointestinal: Negative.   Endocrine: Negative.   Genitourinary: Negative.   Musculoskeletal: Negative.   Skin: Negative.   Neurological: Negative.   Hematological: Negative.   Psychiatric/Behavioral: Negative.    Past Medical History  Diagnosis Date  . Depression   . Allergy   . Hyperlipidemia     History   Social History  . Marital Status: Single    Spouse Name: N/A    Number of Children: N/A  . Years of Education: N/A   Occupational History  . Not on file.   Social History Main Topics  . Smoking status: Never Smoker   . Smokeless tobacco: Not on file  . Alcohol Use: Yes  . Drug Use: No  . Sexual Activity: Yes    Birth Control/ Protection: Other-see comments   Other Topics Concern  . Not on file   Social History Narrative  . No narrative on file    Past Surgical History  Procedure Laterality Date  . Wisdom tooth extraction    . Refractive surgery      Family History  Problem Relation Age of Onset  . Sudden death Mother     suicide  . Depression Mother   . Arthritis Father     RA    No Known Allergies  No current outpatient prescriptions on file prior to visit.   No current facility-administered medications on file prior to visit.    BP 98/62  Pulse 74  Wt 126 lb (57.153 kg)  SpO2 99%chart    Objective:   Physical Exam  Constitutional: She is oriented to person, place, and time. She appears well-developed and well-nourished.  HENT:  Right Ear: External ear normal.  Left Ear: External ear normal.  Nose: Nose normal.    Mouth/Throat: Oropharynx is clear and moist.  Neck: Normal range of motion. Neck supple. No thyromegaly present.  Cardiovascular: Normal rate, regular rhythm and normal heart sounds.   Pulmonary/Chest: Effort normal and breath sounds normal.  Abdominal: Soft. Bowel sounds are normal.  Musculoskeletal: Normal range of motion.  Neurological: She is alert and oriented to person, place, and time.  Skin: Skin is warm and dry.  Psychiatric: She has a normal mood and affect.          Assessment & Plan:  Kynzli was seen today for follow-up.  Diagnoses and associated orders for this visit:  Pure hypercholesterolemia - Lipid Panel - CMP - POCT urinalysis dipstick  Allergic rhinitis, unspecified allergic rhinitis type - CBC with Differential - POCT urinalysis dipstick  Depression - TSH - POCT urinalysis dipstick  Eczema  Other Orders - triamcinolone ointment (KENALOG) 0.1 %; Apply 1 application topically 2 (two) times daily. - triamcinolone cream (KENALOG) 0.1 %; Apply 1 application topically 2 (two) times daily. - sertraline (ZOLOFT) 50 MG tablet; TAKE 1 TABLET BY MOUTH DAILY. - mometasone (NASONEX) 50 MCG/ACT nasal spray; INSTILL 2 SPRAYS INTO THE NOSE DAILY. - etonogestrel-ethinyl estradiol (NUVARING) 0.12-0.015 MG/24HR vaginal ring; USE AS DIRECTED - atorvastatin (LIPITOR) 10 MG tablet; Take 1 tablet (10 mg total) by mouth daily.  Continue current medications. Encouraged healthy diet and exercise. Heart healthy diet. Followup for complete physical in 6 weeks and sooner as needed.

## 2014-02-08 ENCOUNTER — Encounter: Payer: 59 | Admitting: Family

## 2014-03-18 ENCOUNTER — Encounter: Payer: 59 | Admitting: Family

## 2014-05-20 ENCOUNTER — Other Ambulatory Visit: Payer: Self-pay | Admitting: Family

## 2014-05-20 MED ORDER — ATORVASTATIN CALCIUM 10 MG PO TABS: 10.0000 mg | ORAL_TABLET | Freq: Every day | ORAL | Status: DC

## 2014-05-20 NOTE — Telephone Encounter (Signed)
rx sent in electronically 

## 2014-05-20 NOTE — Telephone Encounter (Signed)
Patient needs a re-fill on atorvastatin (LIPITOR) 10 MG tablet. [facility] - [place], [place] - [address]

## 2014-06-12 ENCOUNTER — Encounter: Payer: Self-pay | Admitting: Family

## 2014-06-12 ENCOUNTER — Ambulatory Visit (INDEPENDENT_AMBULATORY_CARE_PROVIDER_SITE_OTHER): Payer: BLUE CROSS/BLUE SHIELD | Admitting: Family

## 2014-06-12 ENCOUNTER — Other Ambulatory Visit (HOSPITAL_COMMUNITY)
Admission: RE | Admit: 2014-06-12 | Discharge: 2014-06-12 | Disposition: A | Discharge status: Home / Self Care | Payer: BLUE CROSS/BLUE SHIELD | Source: Ambulatory Visit | Attending: Family | Admitting: Family

## 2014-06-12 VITALS — BP 100/60 | HR 98 | Temp 98.4°F | Ht 64.5 in | Wt 126.0 lb

## 2014-06-12 DIAGNOSIS — N760 Acute vaginitis: Secondary | ICD-10-CM | POA: Diagnosis not present

## 2014-06-12 DIAGNOSIS — Z01419 Encounter for gynecological examination (general) (routine) without abnormal findings: Secondary | ICD-10-CM | POA: Insufficient documentation

## 2014-06-12 DIAGNOSIS — Z0000 Encounter for general adult medical examination without abnormal findings: Secondary | ICD-10-CM

## 2014-06-12 DIAGNOSIS — Z1151 Encounter for screening for human papillomavirus (HPV): Secondary | ICD-10-CM | POA: Insufficient documentation

## 2014-06-12 DIAGNOSIS — Z113 Encounter for screening for infections with a predominantly sexual mode of transmission: Secondary | ICD-10-CM | POA: Insufficient documentation

## 2014-06-12 DIAGNOSIS — E7800 Pure hypercholesterolemia, unspecified: Secondary | ICD-10-CM

## 2014-06-12 LAB — POCT URINALYSIS DIPSTICK
BILIRUBIN UA: NEGATIVE
GLUCOSE UA: NEGATIVE
Ketones, UA: NEGATIVE
Leukocytes, UA: NEGATIVE
Nitrite, UA: NEGATIVE
PROTEIN UA: NEGATIVE
RBC UA: NEGATIVE
SPEC GRAV UA: 1.02
Urobilinogen, UA: 0.2
pH, UA: 6

## 2014-06-12 LAB — COMPREHENSIVE METABOLIC PANEL
ALBUMIN: 4.3 g/dL (ref 3.5–5.2)
ALT: 10 U/L (ref 0–35)
AST: 16 U/L (ref 0–37)
Alkaline Phosphatase: 49 U/L (ref 39–117)
BILIRUBIN TOTAL: 0.7 mg/dL (ref 0.2–1.2)
BUN: 11 mg/dL (ref 6–23)
CHLORIDE: 103 meq/L (ref 96–112)
CO2: 27 mEq/L (ref 19–32)
Calcium: 9.7 mg/dL (ref 8.4–10.5)
Creatinine, Ser: 0.77 mg/dL (ref 0.40–1.20)
GFR: 90.44 mL/min (ref >60.00)
GLUCOSE: 88 mg/dL (ref 70–99)
POTASSIUM: 4.2 meq/L (ref 3.5–5.1)
SODIUM: 140 meq/L (ref 135–145)
Total Protein: 7.4 g/dL (ref 6.0–8.3)

## 2014-06-12 LAB — CBC WITH DIFFERENTIAL/PLATELET
BASOS PCT: 0.4 % (ref 0.0–3.0)
Basophils Absolute: 0 10*3/uL (ref 0.0–0.1)
Eosinophils Absolute: 0.2 10*3/uL (ref 0.0–0.7)
Eosinophils Relative: 2.5 % (ref 0.0–5.0)
HEMATOCRIT: 41.1 % (ref 36.0–46.0)
Hemoglobin: 14.1 g/dL (ref 12.0–15.0)
LYMPHS ABS: 2.2 10*3/uL (ref 0.7–4.0)
Lymphocytes Relative: 33 % (ref 12.0–46.0)
MCHC: 34.2 g/dL (ref 30.0–36.0)
MCV: 96.4 fl (ref 78.0–100.0)
MONO ABS: 0.6 10*3/uL (ref 0.1–1.0)
Monocytes Relative: 8.7 % (ref 3.0–12.0)
NEUTROS ABS: 3.8 10*3/uL (ref 1.4–7.7)
Neutrophils Relative %: 55.4 % (ref 43.0–77.0)
PLATELETS: 291 10*3/uL (ref 150.0–400.0)
RBC: 4.26 Mil/uL (ref 3.87–5.11)
RDW: 12.8 % (ref 11.5–15.5)
WBC: 6.8 10*3/uL (ref 4.0–10.5)

## 2014-06-12 LAB — LIPID PANEL
Cholesterol: 219 mg/dL — ABNORMAL HIGH (ref 0–200)
HDL: 92.4 mg/dL (ref >39.00)
LDL Cholesterol: 110 mg/dL — ABNORMAL HIGH (ref 0–99)
NonHDL: 126.6
Total CHOL/HDL Ratio: 2
Triglycerides: 83 mg/dL (ref 0.0–149.0)
VLDL: 16.6 mg/dL (ref 0.0–40.0)

## 2014-06-12 LAB — TSH: TSH: 1.61 u[IU]/mL (ref 0.35–4.50)

## 2014-06-12 NOTE — Progress Notes (Signed)
Subjective:    Patient ID: Brianna Mckinney, female    DOB: Jul 22, 1980, 34 y.o.   MRN: [id number]  HPI 34 y.o. White female presents today for a physical.   . I reviewed all health maintenance protocols including mammography, colonoscopy, bone density Needed referrals were placed. Age and diagnosis  appropriate screening labs were ordered. Her immunization history was reviewed and appropriate vaccinations were ordered. Her current medications and allergies were reviewed and needed refills of her chronic medications were ordered. The plan for yearly health maintenance was discussed all orders and referrals were made as appropriate. Pt denies SOB, fever, chills, fatigue and change in appetite.   Past Medical History  Diagnosis Date  . Depression   . Allergy   . Hyperlipidemia     History   Social History  . Marital Status: Single    Spouse Name: N/A    Number of Children: N/A  . Years of Education: N/A   Occupational History  . Not on file.   Social History Main Topics  . Smoking status: Never Smoker   . Smokeless tobacco: Not on file  . Alcohol Use: Yes  . Drug Use: No  . Sexual Activity: Yes    Birth Control/ Protection: Other-see comments   Other Topics Concern  . Not on file   Social History Narrative    Past Surgical History  Procedure Laterality Date  . Wisdom tooth extraction    . Refractive surgery      Family History  Problem Relation Age of Onset  . Sudden death Mother     suicide  . Depression Mother   . Arthritis Father     RA    No Known Allergies  Current Outpatient Prescriptions on File Prior to Visit  Medication Sig Dispense Refill  . atorvastatin (LIPITOR) 10 MG tablet Take 1 tablet (10 mg total) by mouth daily. 90 tablet 1  . etonogestrel-ethinyl estradiol (NUVARING) 0.12-0.015 MG/24HR vaginal ring USE AS DIRECTED 3 each 6  . mometasone (NASONEX) 50 MCG/ACT nasal spray INSTILL 2 SPRAYS INTO THE NOSE DAILY. 17 g 6  . sertraline (ZOLOFT) 50 MG  tablet TAKE 1 TABLET BY MOUTH DAILY. 30 tablet 6  . triamcinolone cream (KENALOG) 0.1 % Apply 1 application topically 2 (two) times daily. 30 g 0  . triamcinolone ointment (KENALOG) 0.1 % Apply 1 application topically 2 (two) times daily. 30 g 0   No current facility-administered medications on file prior to visit.    BP 100/60 mmHg  Pulse 98  Temp(Src) 98.4 F (36.9 C) (Oral)  Ht 5' 4.5" (1.638 m)  Wt 126 lb (57.153 kg)  BMI 21.30 kg/m2  LMP 01/20/2016chart  Review of Systems  Constitutional: Negative.  Negative for fever, activity change, appetite change and fatigue.  HENT: Negative.   Eyes: Negative.   Respiratory: Negative.  Negative for cough and shortness of breath.   Cardiovascular: Negative.  Negative for chest pain.  Gastrointestinal: Negative.  Negative for abdominal pain, diarrhea, constipation and abdominal distention.  Endocrine: Negative.   Genitourinary: Negative.  Negative for dysuria, frequency and flank pain.  Musculoskeletal: Negative.   Skin: Negative.  Negative for color change and rash.  Neurological: Negative.  Negative for dizziness, light-headedness and headaches.  Psychiatric/Behavioral: Negative.        Objective:   Physical Exam  Constitutional: She is oriented to person, place, and time. She appears well-developed and well-nourished. She is active.  HENT:  Head: Normocephalic.  Right Ear: Tympanic membrane, external  ear and ear canal normal.  Left Ear: Tympanic membrane, external ear and ear canal normal.  Nose: Nose normal.  Mouth/Throat: Uvula is midline, oropharynx is clear and moist and mucous membranes are normal.  Eyes: Conjunctivae, EOM and lids are normal. Pupils are equal, round, and reactive to light.  Neck: Trachea normal, normal range of motion and full passive range of motion without pain. Neck supple.  Cardiovascular: Normal rate, regular rhythm, normal heart sounds and normal pulses.   Pulmonary/Chest: Effort normal and breath  sounds normal.  Abdominal: Soft. Normal appearance and bowel sounds are normal.  Genitourinary: Vagina normal. Pelvic exam was performed with patient prone.  Musculoskeletal: Normal range of motion.  Neurological: She is alert and oriented to person, place, and time. She has normal strength.  Skin: Skin is warm, dry and intact.  Psychiatric: She has a normal mood and affect. Her speech is normal and behavior is normal. Thought content normal.          Assessment & Plan:  Brianna Mckinney was seen today for annual exam.  Diagnoses and associated orders for this visit:  Preventative health care - Lipid Panel - CMP - CBC with Differential - TSH - POC Urinalysis Dipstick  Pure hypercholesterolemia - Lipid Panel  Screen for STD (sexually transmitted disease) - PAP [[facility]] - HIV antibody (with reflex)   Encouraged pt to exercise at least 30 minutes per day. Eat a heart healthy diet low in sodium and saturated fat. Safe sex practices discussed.

## 2014-06-12 NOTE — Progress Notes (Signed)
Pre visit review using our clinic review tool, if applicable. No additional management support is needed unless otherwise documented below in the visit note. 

## 2014-06-12 NOTE — Patient Instructions (Signed)
Cardiac Diet This diet can help prevent heart disease and stroke. Many factors influence your heart health, including eating and exercise habits. Coronary risk rises a lot with abnormal blood fat (lipid) levels. Cardiac meal planning includes limiting unhealthy fats, increasing healthy fats, and making other small dietary changes. General guidelines are as follows:  Adjust calorie intake to reach and maintain desirable body weight.  Limit total fat intake to less than 30% of total calories. Saturated fat should be less than 7% of calories.  Saturated fats are found in animal products and in some vegetable products. Saturated vegetable fats are found in coconut oil, cocoa butter, palm oil, and palm kernel oil. Read labels carefully to avoid these products as much as possible. Use butter in moderation. Choose tub margarines and oils that have 2 grams of fat or less. Good cooking oils are canola and olive oils.  Practice low-fat cooking techniques. Do not fry food. Instead, broil, bake, boil, steam, grill, roast on a rack, stir-fry, or microwave it. Other fat reducing suggestions include:  Remove the skin from poultry.  Remove all visible fat from meats.  Skim the fat off stews, soups, and gravies before serving them.  Steam vegetables in water or broth instead of sauting them in fat.  Avoid foods with trans fat (or hydrogenated oils), such as commercially fried foods and commercially baked goods. Commercial shortening and deep-frying fats will contain trans fat.  Increase intake of fruits, vegetables, whole grains, and legumes to replace foods high in fat.  Increase consumption of nuts, legumes, and seeds to at least 4 servings weekly. One serving of a legume equals  cup, and 1 serving of nuts or seeds equals  cup.  Choose whole grains more often. Have 3 servings per day (a serving is 1 ounce [oz]).  Eat 4 to 5 servings of vegetables per day. A serving of vegetables is 1 cup of raw leafy  vegetables;  cup of raw or cooked cut-up vegetables;  cup of vegetable juice.  Eat 4 to 5 servings of fruit per day. A serving of fruit is 1 medium whole fruit;  cup of dried fruit;  cup of fresh, frozen, or canned fruit;  cup of 100% fruit juice.  Increase your intake of dietary fiber to 20 to 30 grams per day. Insoluble fiber may help lower your risk of heart disease and may help curb your appetite.  Soluble fiber binds cholesterol to be removed from the blood. Foods high in soluble fiber are dried beans, citrus fruits, oats, apples, bananas, broccoli, Brussels sprouts, and eggplant.  Try to include foods fortified with plant sterols or stanols, such as yogurt, breads, juices, or margarines. Choose several fortified foods to achieve a daily intake of 2 to 3 grams of plant sterols or stanols.  Foods with omega-3 fats can help reduce your risk of heart disease. Aim to have a 3.5 oz portion of fatty fish twice per week, such as salmon, mackerel, albacore tuna, sardines, lake trout, or herring. If you wish to take a fish oil supplement, choose one that contains 1 gram of both DHA and EPA.  Limit processed meats to 2 servings (3 oz portion) weekly.  Limit the sodium in your diet to 1500 milligrams (mg) per day. If you have high blood pressure, talk to a registered dietitian about a DASH (Dietary Approaches to Stop Hypertension) eating plan.  Limit sweets and beverages with added sugar, such as soda, to no more than 5 servings per week. One   serving is:   1 tablespoon sugar.  1 tablespoon jelly or jam.   cup sorbet.  1 cup lemonade.   cup regular soda. CHOOSING FOODS Starches  Allowed: Breads: All kinds (wheat, rye, raisin, white, oatmeal, Italian, French, and English muffin bread). Low-fat rolls: English muffins, frankfurter and hamburger buns, bagels, pita bread, tortillas (not fried). Pancakes, waffles, biscuits, and muffins made with recommended oil.  Avoid: Products made with  saturated or trans fats, oils, or whole milk products. Butter rolls, cheese breads, croissants. Commercial doughnuts, muffins, sweet rolls, biscuits, waffles, pancakes, store-bought mixes. Crackers  Allowed: Low-fat crackers and snacks: Animal, graham, rye, saltine (with recommended oil, no lard), oyster, and matzo crackers. Bread sticks, melba toast, rusks, flatbread, pretzels, and light popcorn.  Avoid: High-fat crackers: cheese crackers, butter crackers, and those made with coconut, palm oil, or trans fat (hydrogenated oils). Buttered popcorn. Cereals  Allowed: Hot or cold whole-grain cereals.  Avoid: Cereals containing coconut, hydrogenated vegetable fat, or animal fat. Potatoes / Pasta / Rice  Allowed: All kinds of potatoes, rice, and pasta (such as macaroni, spaghetti, and noodles).  Avoid: Pasta or rice prepared with cream sauce or high-fat cheese. Chow mein noodles, French fries. Vegetables  Allowed: All vegetables and vegetable juices.  Avoid: Fried vegetables. Vegetables in cream, butter, or high-fat cheese sauces. Limit coconut. Fruit in cream or custard. Protein  Allowed: Limit your intake of meat, seafood, and poultry to no more than 6 oz (cooked weight) per day. All lean, well-trimmed beef, veal, pork, and lamb. All chicken and turkey without skin. All fish and shellfish. Wild game: wild duck, rabbit, pheasant, and venison. Egg whites or low-cholesterol egg substitutes may be used as desired. Meatless dishes: recipes with dried beans, peas, lentils, and tofu (soybean curd). Seeds and nuts: all seeds and most nuts.  Avoid: Prime grade and other heavily marbled and fatty meats, such as short ribs, spare ribs, rib eye roast or steak, frankfurters, sausage, bacon, and high-fat luncheon meats, mutton. Caviar. Commercially fried fish. Domestic duck, goose, venison sausage. Organ meats: liver, gizzard, heart, chitterlings, brains, kidney, sweetbreads. Dairy  Allowed: Low-fat  cheeses: nonfat or low-fat cottage cheese (1% or 2% fat), cheeses made with part skim milk, such as mozzarella, farmers, string, or ricotta. (Cheeses should be labeled no more than 2 to 6 grams fat per oz.). Skim (or 1%) milk: liquid, powdered, or evaporated. Buttermilk made with low-fat milk. Drinks made with skim or low-fat milk or cocoa. Chocolate milk or cocoa made with skim or low-fat (1%) milk. Nonfat or low-fat yogurt.  Avoid: Whole milk cheeses, including colby, cheddar, muenster, Monterey Jack, Havarti, Brie, Camembert, American, Swiss, and blue. Creamed cottage cheese, cream cheese. Whole milk and whole milk products, including buttermilk or yogurt made from whole milk, drinks made from whole milk. Condensed milk, evaporated whole milk, and 2% milk. Soups and Combination Foods  Allowed: Low-fat low-sodium soups: broth, dehydrated soups, homemade broth, soups with the fat removed, homemade cream soups made with skim or low-fat milk. Low-fat spaghetti, lasagna, chili, and Spanish rice if low-fat ingredients and low-fat cooking techniques are used.  Avoid: Cream soups made with whole milk, cream, or high-fat cheese. All other soups. Desserts and Sweets  Allowed: Sherbet, fruit ices, gelatins, meringues, and angel food cake. Homemade desserts with recommended fats, oils, and milk products. Jam, jelly, honey, marmalade, sugars, and syrups. Pure sugar candy, such as gum drops, hard candy, jelly beans, marshmallows, mints, and small amounts of dark chocolate.  Avoid: Commercially prepared   cakes, pies, cookies, frosting, pudding, or mixes for these products. Desserts containing whole milk products, chocolate, coconut, lard, palm oil, or palm kernel oil. Ice cream or ice cream drinks. Candy that contains chocolate, coconut, butter, hydrogenated fat, or unknown ingredients. Buttered syrups. Fats and Oils  Allowed: Vegetable oils: safflower, sunflower, corn, soybean, cottonseed, sesame, canola, olive,  or peanut. Non-hydrogenated margarines. Salad dressing or mayonnaise: homemade or commercial, made with a recommended oil. Low or nonfat salad dressing or mayonnaise.  Limit added fats and oils to 6 to 8 tsp per day (includes fats used in cooking, baking, salads, and spreads on bread). Remember to count the "hidden fats" in foods.  Avoid: Solid fats and shortenings: butter, lard, salt pork, bacon drippings. Gravy containing meat fat, shortening, or suet. Cocoa butter, coconut. Coconut oil, palm oil, palm kernel oil, or hydrogenated oils: these ingredients are often used in bakery products, nondairy creamers, whipped toppings, candy, and commercially fried foods. Read labels carefully. Salad dressings made of unknown oils, sour cream, or cheese, such as blue cheese and Roquefort. Cream, all kinds: half-and-half, light, heavy, or whipping. Sour cream or cream cheese (even if "light" or low-fat). Nondairy cream substitutes: coffee creamers and sour cream substitutes made with palm, palm kernel, hydrogenated oils, or coconut oil. Beverages  Allowed: Coffee (regular or decaffeinated), tea. Diet carbonated beverages, mineral water. Alcohol: Check with your caregiver. Moderation is recommended.  Avoid: Whole milk, regular sodas, and juice drinks with added sugar. Condiments  Allowed: All seasonings and condiments. Cocoa powder. "Cream" sauces made with recommended ingredients.  Avoid: Carob powder made with hydrogenated fats. SAMPLE MENU Breakfast   cup orange juice   cup oatmeal  1 slice toast  1 tsp margarine  1 cup skim milk Lunch  Turkey sandwich with 2 oz turkey, 2 slices bread  Lettuce and tomato slices  Fresh fruit  Carrot sticks  Coffee or tea Snack  Fresh fruit or low-fat crackers Dinner  3 oz lean ground beef  1 baked potato  1 tsp margarine   cup asparagus  Lettuce salad  1 tbs non-creamy dressing   cup peach slices  1 cup skim milk Document Released:  02/10/2008 Document Revised: 11/02/2011 Document Reviewed: 07/03/2013 [employer] Patient Information 2015 [employer]. This information is not intended to replace advice given to you by your health care provider. Make sure you discuss any questions you have with your health care provider.  

## 2014-06-13 LAB — HIV ANTIBODY (ROUTINE TESTING W REFLEX): HIV 1&2 Ab, 4th Generation: NONREACTIVE

## 2014-06-14 LAB — CERVICOVAGINAL ANCILLARY ONLY
Bacterial vaginitis: POSITIVE — AB
Candida vaginitis: NEGATIVE

## 2014-06-14 LAB — CYTOLOGY - PAP

## 2014-06-17 LAB — CERVICOVAGINAL ANCILLARY ONLY: Herpes: NEGATIVE

## 2014-11-11 ENCOUNTER — Other Ambulatory Visit: Payer: Self-pay | Admitting: *Deleted

## 2014-11-11 MED ORDER — MOMETASONE FUROATE 50 MCG/ACT NA SUSP: NASAL | Status: DC

## 2014-11-21 ENCOUNTER — Inpatient Hospital Stay (HOSPITAL_COMMUNITY)
Admission: EM | Admit: 2014-11-21 | Discharge: 2014-11-26 | DRG: 392 | Disposition: A | Discharge status: Home / Self Care | Payer: BLUE CROSS/BLUE SHIELD | Attending: Internal Medicine | Admitting: Internal Medicine

## 2014-11-21 ENCOUNTER — Emergency Department (HOSPITAL_COMMUNITY): Payer: BLUE CROSS/BLUE SHIELD

## 2014-11-21 ENCOUNTER — Encounter (HOSPITAL_COMMUNITY): Payer: Self-pay | Admitting: *Deleted

## 2014-11-21 DIAGNOSIS — R1031 Right lower quadrant pain: Secondary | ICD-10-CM | POA: Insufficient documentation

## 2014-11-21 DIAGNOSIS — E7800 Pure hypercholesterolemia, unspecified: Secondary | ICD-10-CM | POA: Diagnosis present

## 2014-11-21 DIAGNOSIS — J302 Other seasonal allergic rhinitis: Secondary | ICD-10-CM | POA: Diagnosis present

## 2014-11-21 DIAGNOSIS — R1032 Left lower quadrant pain: Secondary | ICD-10-CM

## 2014-11-21 DIAGNOSIS — E785 Hyperlipidemia, unspecified: Secondary | ICD-10-CM | POA: Diagnosis present

## 2014-11-21 DIAGNOSIS — K5289 Other specified noninfective gastroenteritis and colitis: Principal | ICD-10-CM | POA: Diagnosis present

## 2014-11-21 DIAGNOSIS — E876 Hypokalemia: Secondary | ICD-10-CM | POA: Diagnosis present

## 2014-11-21 DIAGNOSIS — E860 Dehydration: Secondary | ICD-10-CM | POA: Diagnosis present

## 2014-11-21 DIAGNOSIS — K529 Noninfective gastroenteritis and colitis, unspecified: Secondary | ICD-10-CM | POA: Diagnosis present

## 2014-11-21 DIAGNOSIS — J309 Allergic rhinitis, unspecified: Secondary | ICD-10-CM | POA: Diagnosis present

## 2014-11-21 DIAGNOSIS — K921 Melena: Secondary | ICD-10-CM | POA: Diagnosis present

## 2014-11-21 DIAGNOSIS — F32A Depression, unspecified: Secondary | ICD-10-CM | POA: Diagnosis present

## 2014-11-21 DIAGNOSIS — F329 Major depressive disorder, single episode, unspecified: Secondary | ICD-10-CM | POA: Diagnosis present

## 2014-11-21 HISTORY — DX: Urinary tract infection, site not specified: N39.0

## 2014-11-21 LAB — CBC WITH DIFFERENTIAL/PLATELET
Basophils Absolute: 0 10*3/uL (ref 0.0–0.1)
Basophils Relative: 0 % (ref 0–1)
EOS PCT: 0 % (ref 0–5)
Eosinophils Absolute: 0 10*3/uL (ref 0.0–0.7)
HEMATOCRIT: 39.6 % (ref 36.0–46.0)
Hemoglobin: 13.9 g/dL (ref 12.0–15.0)
LYMPHS ABS: 0.8 10*3/uL (ref 0.7–4.0)
LYMPHS PCT: 6 % — AB (ref 12–46)
MCH: 33.2 pg (ref 26.0–34.0)
MCHC: 35.1 g/dL (ref 30.0–36.0)
MCV: 94.5 fL (ref 78.0–100.0)
MONO ABS: 0.7 10*3/uL (ref 0.1–1.0)
Monocytes Relative: 6 % (ref 3–12)
Neutro Abs: 10.8 10*3/uL — ABNORMAL HIGH (ref 1.7–7.7)
Neutrophils Relative %: 88 % — ABNORMAL HIGH (ref 43–77)
Platelets: 234 10*3/uL (ref 150–400)
RBC: 4.19 MIL/uL (ref 3.87–5.11)
RDW: 12.3 % (ref 11.5–15.5)
WBC: 12.3 10*3/uL — AB (ref 4.0–10.5)

## 2014-11-21 LAB — URINALYSIS, ROUTINE W REFLEX MICROSCOPIC
BILIRUBIN URINE: NEGATIVE
GLUCOSE, UA: NEGATIVE mg/dL
Ketones, ur: >80 mg/dL — AB
Leukocytes, UA: NEGATIVE
Nitrite: NEGATIVE
PH: 6 (ref 5.0–8.0)
Protein, ur: NEGATIVE mg/dL
SPECIFIC GRAVITY, URINE: 1.036 — AB (ref 1.005–1.030)
Urobilinogen, UA: 0.2 mg/dL (ref 0.0–1.0)

## 2014-11-21 LAB — COMPREHENSIVE METABOLIC PANEL
ALT: 16 U/L (ref 14–54)
ANION GAP: 13 (ref 5–15)
AST: 21 U/L (ref 15–41)
Albumin: 3.8 g/dL (ref 3.5–5.0)
Alkaline Phosphatase: 50 U/L (ref 38–126)
BUN: 10 mg/dL (ref 6–20)
CALCIUM: 9 mg/dL (ref 8.9–10.3)
CO2: 21 mmol/L — AB (ref 22–32)
CREATININE: 0.73 mg/dL (ref 0.44–1.00)
Chloride: 101 mmol/L (ref 101–111)
GLUCOSE: 147 mg/dL — AB (ref 65–99)
Potassium: 3.4 mmol/L — ABNORMAL LOW (ref 3.5–5.1)
Sodium: 135 mmol/L (ref 135–145)
Total Bilirubin: 0.8 mg/dL (ref 0.3–1.2)
Total Protein: 7.5 g/dL (ref 6.5–8.1)

## 2014-11-21 LAB — PREGNANCY, URINE: PREG TEST UR: NEGATIVE

## 2014-11-21 LAB — URINE MICROSCOPIC-ADD ON

## 2014-11-21 LAB — POC OCCULT BLOOD, ED: Fecal Occult Bld: POSITIVE — AB

## 2014-11-21 MED ORDER — MORPHINE SULFATE 4 MG/ML IJ SOLN
4.0000 mg | Freq: Once | INTRAMUSCULAR | Status: AC
  Administered 2014-11-21: 4 mg via INTRAVENOUS
  Filled 2014-11-21: qty 1

## 2014-11-21 MED ORDER — SODIUM CHLORIDE 0.9 % IV BOLUS (SEPSIS)
1000.0000 mL | Freq: Once | INTRAVENOUS | Status: AC
  Administered 2014-11-21: 1000 mL via INTRAVENOUS

## 2014-11-21 MED ORDER — ONDANSETRON HCL 4 MG/2ML IJ SOLN
4.0000 mg | Freq: Once | INTRAMUSCULAR | Status: AC
  Administered 2014-11-21: 4 mg via INTRAVENOUS
  Filled 2014-11-21: qty 2

## 2014-11-21 NOTE — ED Provider Notes (Signed)
CSN: [id number]     Arrival date & time 11/21/14  2153 History   First MD Initiated Contact with Patient 11/21/14 2228     Chief Complaint  Patient presents with  . Abdominal Pain     (Consider location/radiation/quality/duration/timing/severity/associated sxs/prior Treatment) The history is provided by the patient and medical records. No language interpreter was used.    Saylah Ketner is a 34 y.o. female  with a hx of depression, allergy, UTI presents to the Emergency Department complaining of gradual, persistent, progressively worsening generalized abd pain onset this morning. Pt reports that she has had 3 days of associated diarrhea and nausea.  Pt describes her abd pain as stabbing, rated at a 8/10 and without radiation.  Other associated symptoms include BRBPR that is mixed with diarrhea.  She reports she feels like she is simply passing blood at this point.  She denies melena or hematemesis.  Nothing makes it better or worse.  Pt denies fever, chills, headache, neck pain, chest pain, SOB, weakness, dizziness, syncope, dysuria, hematuria, urinary frequency or urinary urgency, vaginal discharge.  Pt is sexually active with 1 female partner; NuvaRing.   Denies Hx of STD.  LMP:11/04/14   G0P0   Past Medical History  Diagnosis Date  . Depression   . Allergy   . Hyperlipidemia   . UTI (lower urinary tract infection)    Past Surgical History  Procedure Laterality Date  . Wisdom tooth extraction    . Refractive surgery    . Eye surgery      right eye   Family History  Problem Relation Age of Onset  . Sudden death Mother     suicide  . Depression Mother   . Arthritis Father     RA   History  Substance Use Topics  . Smoking status: Never Smoker   . Smokeless tobacco: Not on file  . Alcohol Use: Yes   OB History    No data available     Review of Systems  Constitutional: Negative for fever, diaphoresis, appetite change, fatigue and unexpected weight change.  HENT: Negative for  mouth sores and trouble swallowing.   Respiratory: Negative for cough, chest tightness, shortness of breath, wheezing and stridor.   Cardiovascular: Negative for chest pain and palpitations.  Gastrointestinal: Positive for nausea, vomiting, abdominal pain, diarrhea, blood in stool and anal bleeding. Negative for constipation, abdominal distention and rectal pain.  Genitourinary: Negative for dysuria, urgency, frequency, hematuria, flank pain and difficulty urinating.  Musculoskeletal: Negative for back pain, neck pain and neck stiffness.  Skin: Negative for rash.  Neurological: Negative for weakness.  Hematological: Negative for adenopathy.  Psychiatric/Behavioral: Negative for confusion.  All other systems reviewed and are negative.     Allergies  Review of patient's allergies indicates no known allergies.  Home Medications   Prior to Admission medications   Medication Sig Start Date End Date Taking? Authorizing Provider  Armodafinil (NUVIGIL) 150 MG tablet Take 75-150 mg by mouth daily as needed (for alertness).   Yes Historical Provider, MD  atorvastatin (LIPITOR) 10 MG tablet Take 1 tablet (10 mg total) by mouth daily. 05/20/14  Yes Eulis Foster, FNP  etonogestrel-ethinyl estradiol (NUVARING) 0.12-0.015 MG/24HR vaginal ring USE AS DIRECTED 01/09/14  Yes Eulis Foster, FNP  fexofenadine (ALLEGRA) 180 MG tablet Take 180 mg by mouth daily.   Yes Historical Provider, MD  folic acid (FOLVITE) 800 MCG tablet Take 400 mcg by mouth daily.   Yes Historical Provider, MD  mometasone (  NASONEX) 50 MCG/ACT nasal spray INSTILL 2 SPRAYS INTO THE NOSE DAILY. 11/11/14  Yes Eulis Foster, FNP  Multiple Vitamin (MULTIVITAMIN WITH MINERALS) TABS tablet Take 1 tablet by mouth daily. One-A-Day Women's Vitamin.   Yes Historical Provider, MD  Naphazoline-Pheniramine (OPCON-A OP) Apply 1 drop to eye 3 (three) times daily as needed (for eye irritation).   Yes Historical Provider, MD  sertraline (ZOLOFT) 50 MG  tablet TAKE 1 TABLET BY MOUTH DAILY. 01/09/14  Yes Eulis Foster, FNP  triamcinolone cream (KENALOG) 0.1 % Apply 1 application topically 2 (two) times daily. Patient taking differently: Apply 1 application topically 2 (two) times daily as needed (for ezcema.).  01/09/14  Yes Eulis Foster, FNP  triamcinolone ointment (KENALOG) 0.1 % Apply 1 application topically 2 (two) times daily. Patient taking differently: Apply 1 application topically 2 (two) times daily as needed (for ezcema).  01/09/14  Yes Eulis Foster, FNP   BP 125/72 mmHg  Pulse 72  Temp(Src) 98.5 F (36.9 C) (Oral)  Resp 17  SpO2 100% Physical Exam  Constitutional: She appears well-developed and well-nourished. No distress.  Awake, alert, nontoxic appearance  HENT:  Head: Normocephalic and atraumatic.  Mouth/Throat: Oropharynx is clear and moist. No oropharyngeal exudate.  Eyes: Conjunctivae are normal. No scleral icterus.  Neck: Normal range of motion. Neck supple.  Cardiovascular: Normal rate, regular rhythm, normal heart sounds and intact distal pulses.   No murmur heard. Pulmonary/Chest: Effort normal and breath sounds normal. No respiratory distress. She has no wheezes.  Equal chest expansion  Abdominal: Soft. Bowel sounds are normal. She exhibits no distension and no mass. There is tenderness in the right lower quadrant, suprapubic area and left lower quadrant. There is no rebound, no guarding and no CVA tenderness.  Mild TTP of the lower abd.   No rebound or guarding   Genitourinary: Rectal exam shows no external hemorrhoid, no internal hemorrhoid, no fissure, no mass, no tenderness and anal tone normal. Guaiac positive stool.  Frank BRB on DRE No fissure or hemorrhoid  Musculoskeletal: Normal range of motion. She exhibits no edema.  Neurological: She is alert.  Speech is clear and goal oriented Moves extremities without ataxia  Skin: Skin is warm and dry. She is not diaphoretic.  Psychiatric: She has a normal  mood and affect.  Nursing note and vitals reviewed.   ED Course  Procedures (including critical care time) Labs Review Labs Reviewed  CBC WITH DIFFERENTIAL/PLATELET - Abnormal; Notable for the following:    WBC 12.3 (*)    Neutrophils Relative % 88 (*)    Neutro Abs 10.8 (*)    Lymphocytes Relative 6 (*)    All other components within normal limits  COMPREHENSIVE METABOLIC PANEL - Abnormal; Notable for the following:    Potassium 3.4 (*)    CO2 21 (*)    Glucose, Bld 147 (*)    All other components within normal limits  URINALYSIS, ROUTINE W REFLEX MICROSCOPIC (NOT AT [facility]) - Abnormal; Notable for the following:    APPearance TURBID (*)    Specific Gravity, Urine 1.036 (*)    Hgb urine dipstick TRACE (*)    Ketones, ur >80 (*)    All other components within normal limits  URINE MICROSCOPIC-ADD ON - Abnormal; Notable for the following:    Bacteria, UA MANY (*)    All other components within normal limits  POC OCCULT BLOOD, ED - Abnormal; Notable for the following:    Fecal Occult Bld POSITIVE (*)  All other components within normal limits  STOOL CULTURE  PREGNANCY, URINE  GI PATHOGEN PANEL BY PCR, STOOL    Imaging Review No results found.   EKG Interpretation None      MDM   Final diagnoses:  Bilateral lower abdominal pain  Bilateral lower abdominal pain   Duane BostonMaria Currie presents with lower and mid abdominal pain onset this morning after several days of diarrhea with BRBPR with the onset of her pain.  No source of bleeding found on DRE and no rectal pain.     1:26 AM Pt with persistent abd pain.  She is dehydrated with >80 keytones in her urine.  Leukocytosis noted.  UA without evidence of UTI.  CT scan pending.   Pt discussed with Dr. Norlene Campbelltter who will follow CT scan and dispo accordingly.  Another liter of fluid has been ordered.  No emesis here in the department, but pt continues to have frankly bloody stools.  VSS and pt without tachycardia or hypotension.     BP 125/72 mmHg  Pulse 72  Temp(Src) 98.5 F (36.9 C) (Oral)  Resp 17  SpO2 100%   Dierdre ForthHannah Khadejah Son, PA-C 11/22/14 0129  Lorre NickAnthony Allen, MD 11/22/14 2315

## 2014-11-21 NOTE — ED Notes (Signed)
Bed: WA17 Expected date:  Expected time:  Means of arrival:  Comments: EMS/77F/n/v/d

## 2014-11-21 NOTE — ED Notes (Signed)
EMS called to home.  Found patient with complaints of abdominal pain with  Nausea and vomiting.  Patient add that she has also started having diarrhea  That has bright red blood that started two days ago.  Currently she rates her  Pain 8 of 10.  Patient is ambulatory.

## 2014-11-22 ENCOUNTER — Encounter (HOSPITAL_COMMUNITY): Payer: Self-pay

## 2014-11-22 DIAGNOSIS — E780 Pure hypercholesterolemia: Secondary | ICD-10-CM | POA: Diagnosis not present

## 2014-11-22 DIAGNOSIS — K625 Hemorrhage of anus and rectum: Secondary | ICD-10-CM

## 2014-11-22 DIAGNOSIS — K529 Noninfective gastroenteritis and colitis, unspecified: Secondary | ICD-10-CM | POA: Diagnosis present

## 2014-11-22 DIAGNOSIS — R1032 Left lower quadrant pain: Secondary | ICD-10-CM | POA: Diagnosis not present

## 2014-11-22 DIAGNOSIS — E860 Dehydration: Secondary | ICD-10-CM | POA: Diagnosis present

## 2014-11-22 DIAGNOSIS — R1031 Right lower quadrant pain: Secondary | ICD-10-CM | POA: Diagnosis present

## 2014-11-22 DIAGNOSIS — K5289 Other specified noninfective gastroenteritis and colitis: Secondary | ICD-10-CM | POA: Diagnosis present

## 2014-11-22 DIAGNOSIS — R197 Diarrhea, unspecified: Secondary | ICD-10-CM | POA: Diagnosis not present

## 2014-11-22 DIAGNOSIS — F329 Major depressive disorder, single episode, unspecified: Secondary | ICD-10-CM

## 2014-11-22 DIAGNOSIS — R935 Abnormal findings on diagnostic imaging of other abdominal regions, including retroperitoneum: Secondary | ICD-10-CM

## 2014-11-22 DIAGNOSIS — K921 Melena: Secondary | ICD-10-CM | POA: Diagnosis present

## 2014-11-22 DIAGNOSIS — R1084 Generalized abdominal pain: Secondary | ICD-10-CM | POA: Diagnosis not present

## 2014-11-22 DIAGNOSIS — E876 Hypokalemia: Secondary | ICD-10-CM | POA: Diagnosis present

## 2014-11-22 DIAGNOSIS — E785 Hyperlipidemia, unspecified: Secondary | ICD-10-CM | POA: Diagnosis present

## 2014-11-22 DIAGNOSIS — J309 Allergic rhinitis, unspecified: Secondary | ICD-10-CM

## 2014-11-22 DIAGNOSIS — J302 Other seasonal allergic rhinitis: Secondary | ICD-10-CM | POA: Diagnosis present

## 2014-11-22 LAB — COMPREHENSIVE METABOLIC PANEL
ALBUMIN: 3.2 g/dL — AB (ref 3.5–5.0)
ALT: 13 U/L — AB (ref 14–54)
AST: 18 U/L (ref 15–41)
Alkaline Phosphatase: 45 U/L (ref 38–126)
Anion gap: 9 (ref 5–15)
BUN: 7 mg/dL (ref 6–20)
CO2: 21 mmol/L — AB (ref 22–32)
CREATININE: 0.58 mg/dL (ref 0.44–1.00)
Calcium: 7.7 mg/dL — ABNORMAL LOW (ref 8.9–10.3)
Chloride: 104 mmol/L (ref 101–111)
GFR calc Af Amer: >60 mL/min (ref >60)
Glucose, Bld: 164 mg/dL — ABNORMAL HIGH (ref 65–99)
Potassium: 3.6 mmol/L (ref 3.5–5.1)
SODIUM: 134 mmol/L — AB (ref 135–145)
Total Bilirubin: 0.6 mg/dL (ref 0.3–1.2)
Total Protein: 6.6 g/dL (ref 6.5–8.1)

## 2014-11-22 LAB — SEDIMENTATION RATE: SED RATE: 9 mm/h (ref 0–22)

## 2014-11-22 LAB — CBC
HCT: 38.4 % (ref 36.0–46.0)
HEMOGLOBIN: 13.3 g/dL (ref 12.0–15.0)
MCH: 33.3 pg (ref 26.0–34.0)
MCHC: 34.6 g/dL (ref 30.0–36.0)
MCV: 96 fL (ref 78.0–100.0)
Platelets: 205 10*3/uL (ref 150–400)
RBC: 4 MIL/uL (ref 3.87–5.11)
RDW: 12.4 % (ref 11.5–15.5)
WBC: 12.3 10*3/uL — ABNORMAL HIGH (ref 4.0–10.5)

## 2014-11-22 LAB — PROTIME-INR
INR: 1.05 (ref 0.00–1.49)
Prothrombin Time: 13.9 s (ref 11.6–15.2)

## 2014-11-22 LAB — C-REACTIVE PROTEIN: CRP: 7.2 mg/dL — ABNORMAL HIGH (ref <1.0)

## 2014-11-22 LAB — LACTIC ACID, PLASMA: Lactic Acid, Venous: 0.9 mmol/L (ref 0.5–2.0)

## 2014-11-22 MED ORDER — ONDANSETRON HCL 4 MG PO TABS
4.0000 mg | ORAL_TABLET | Freq: Four times a day (QID) | ORAL | Status: DC | PRN
  Administered 2014-11-23 – 2014-11-26 (×2): 4 mg via ORAL
  Filled 2014-11-22 (×2): qty 1

## 2014-11-22 MED ORDER — SODIUM CHLORIDE 0.9 % IV SOLN
INTRAVENOUS | Status: DC
  Administered 2014-11-22 – 2014-11-24 (×5): via INTRAVENOUS

## 2014-11-22 MED ORDER — METRONIDAZOLE IN NACL 5-0.79 MG/ML-% IV SOLN
500.0000 mg | Freq: Once | INTRAVENOUS | Status: AC
  Administered 2014-11-22: 500 mg via INTRAVENOUS
  Filled 2014-11-22: qty 100

## 2014-11-22 MED ORDER — SERTRALINE HCL 50 MG PO TABS
50.0000 mg | ORAL_TABLET | Freq: Every day | ORAL | Status: DC
  Administered 2014-11-22 – 2014-11-26 (×5): 50 mg via ORAL
  Filled 2014-11-22 (×5): qty 1

## 2014-11-22 MED ORDER — CIPROFLOXACIN IN D5W 400 MG/200ML IV SOLN
400.0000 mg | Freq: Two times a day (BID) | INTRAVENOUS | Status: DC
  Administered 2014-11-22 – 2014-11-25 (×6): 400 mg via INTRAVENOUS
  Filled 2014-11-22 (×7): qty 200

## 2014-11-22 MED ORDER — ACETAMINOPHEN 650 MG RE SUPP
650.0000 mg | Freq: Four times a day (QID) | RECTAL | Status: DC | PRN
Start: 2014-11-22 — End: 2014-11-27

## 2014-11-22 MED ORDER — CIPROFLOXACIN IN D5W 400 MG/200ML IV SOLN
400.0000 mg | Freq: Once | INTRAVENOUS | Status: AC
  Administered 2014-11-22: 400 mg via INTRAVENOUS
  Filled 2014-11-22: qty 200

## 2014-11-22 MED ORDER — SODIUM CHLORIDE 0.9 % IV BOLUS (SEPSIS)
1000.0000 mL | Freq: Once | INTRAVENOUS | Status: AC
  Administered 2014-11-22: 1000 mL via INTRAVENOUS

## 2014-11-22 MED ORDER — ONDANSETRON HCL 4 MG/2ML IJ SOLN
4.0000 mg | Freq: Four times a day (QID) | INTRAMUSCULAR | Status: DC | PRN
  Administered 2014-11-22 – 2014-11-26 (×10): 4 mg via INTRAVENOUS
  Filled 2014-11-22 (×11): qty 2

## 2014-11-22 MED ORDER — FLUTICASONE PROPIONATE 50 MCG/ACT NA SUSP
2.0000 | Freq: Every day | NASAL | Status: DC
  Administered 2014-11-23 – 2014-11-26 (×4): 2 via NASAL
  Filled 2014-11-22: qty 16

## 2014-11-22 MED ORDER — MORPHINE SULFATE 4 MG/ML IJ SOLN
4.0000 mg | Freq: Once | INTRAMUSCULAR | Status: AC
  Administered 2014-11-22: 4 mg via INTRAVENOUS
  Filled 2014-11-22: qty 1

## 2014-11-22 MED ORDER — HYDROMORPHONE HCL 1 MG/ML IJ SOLN
1.0000 mg | INTRAMUSCULAR | Status: DC | PRN
  Administered 2014-11-22: 2 mg via INTRAVENOUS
  Administered 2014-11-22 – 2014-11-25 (×18): 1 mg via INTRAVENOUS
  Filled 2014-11-22 (×16): qty 1
  Filled 2014-11-22: qty 2
  Filled 2014-11-22 (×2): qty 1

## 2014-11-22 MED ORDER — IOHEXOL 300 MG/ML  SOLN
100.0000 mL | Freq: Once | INTRAMUSCULAR | Status: AC | PRN
  Administered 2014-11-22: 100 mL via INTRAVENOUS

## 2014-11-22 MED ORDER — HYDROMORPHONE HCL 1 MG/ML IJ SOLN
0.5000 mg | INTRAMUSCULAR | Status: DC | PRN
  Administered 2014-11-22 (×2): 0.5 mg via INTRAVENOUS
  Filled 2014-11-22 (×2): qty 1

## 2014-11-22 MED ORDER — HYDROMORPHONE HCL 1 MG/ML IJ SOLN
0.5000 mg | INTRAMUSCULAR | Status: DC | PRN
  Filled 2014-11-22: qty 1

## 2014-11-22 MED ORDER — METRONIDAZOLE IN NACL 5-0.79 MG/ML-% IV SOLN
500.0000 mg | Freq: Three times a day (TID) | INTRAVENOUS | Status: DC
  Administered 2014-11-22 – 2014-11-25 (×9): 500 mg via INTRAVENOUS
  Filled 2014-11-22 (×10): qty 100

## 2014-11-22 MED ORDER — ACETAMINOPHEN 325 MG PO TABS: 650.0000 mg | ORAL_TABLET | Freq: Four times a day (QID) | ORAL | Status: DC | PRN

## 2014-11-22 MED ORDER — LORATADINE 10 MG PO TABS
10.0000 mg | ORAL_TABLET | Freq: Every day | ORAL | Status: DC
  Administered 2014-11-23 – 2014-11-26 (×4): 10 mg via ORAL
  Filled 2014-11-22 (×5): qty 1

## 2014-11-22 NOTE — H&P (Signed)
[facility] History and Physical  Patient: Brianna BostonMaria Mckinney  MRN: [id number]  DOB: Sep 14, 1980  DOS: the patient was seen and examined on 11/22/2014 PCP: Eulis FosterPadonda B Webb, FNP  Referring physician: Dr. Norlene Campbellotter Chief Complaint: Diarrhea  HPI: Brianna BostonMaria Mckinney is a 34 y.o. female with Past medical history of depression, dyslipidemia, seasonal allergies. The patient is presenting with complaints of diarrhea associated with nausea and vomiting. She mentions that since last 3-4 days she has been having some generalized abdominal pain which is sharp and stabbing occurring on and off. This is also social with multiple episodes of diarrhea. Today she started having bright red blood in her stool as well. She also had multiple episodes of vomiting without any blood with normal nonbilious vomiting. She is the time of my evaluation the complaints that she has some nausea. She also complains of severe abdominal pain which is improving. She mentions she felt warm and also had some chills. Denies any cough denies any chest pain denies any recent antibiotics use denies any burning urination denies any bleeding anywhere else. She denies any similar episode in the past. No recent travel and no recent change in medication.  The patient is coming from home.  At her baseline ambulates without any support And is independent for most of her ADL manages her medication on her own.  Review of Systems: as mentioned in the history of present illness.  A comprehensive review of the other systems is negative.  Past Medical History  Diagnosis Date  . Depression   . Allergy   . Hyperlipidemia   . UTI (lower urinary tract infection)    Past Surgical History  Procedure Laterality Date  . Wisdom tooth extraction    . Refractive surgery    . Eye surgery      right eye   Social History:  reports that she has never smoked. She does not have any smokeless tobacco history on file. She reports that she drinks alcohol. She  reports that she does not use illicit drugs.  No Known Allergies  Family History  Problem Relation Age of Onset  . Sudden death Mother     suicide  . Depression Mother   . Arthritis Father     RA    Prior to Admission medications   Medication Sig Start Date End Date Taking? Authorizing Provider  Armodafinil (NUVIGIL) 150 MG tablet Take 75-150 mg by mouth daily as needed (for alertness).   Yes Historical Provider, MD  atorvastatin (LIPITOR) 10 MG tablet Take 1 tablet (10 mg total) by mouth daily. 05/20/14  Yes Eulis FosterPadonda B Webb, FNP  etonogestrel-ethinyl estradiol (NUVARING) 0.12-0.015 MG/24HR vaginal ring USE AS DIRECTED 01/09/14  Yes Eulis FosterPadonda B Webb, FNP  fexofenadine (ALLEGRA) 180 MG tablet Take 180 mg by mouth daily.   Yes Historical Provider, MD  folic acid (FOLVITE) 800 MCG tablet Take 400 mcg by mouth daily.   Yes Historical Provider, MD  mometasone (NASONEX) 50 MCG/ACT nasal spray INSTILL 2 SPRAYS INTO THE NOSE DAILY. 11/11/14  Yes Eulis FosterPadonda B Webb, FNP  Multiple Vitamin (MULTIVITAMIN WITH MINERALS) TABS tablet Take 1 tablet by mouth daily. One-A-Day Women's Vitamin.   Yes Historical Provider, MD  Naphazoline-Pheniramine (OPCON-A OP) Apply 1 drop to eye 3 (three) times daily as needed (for eye irritation).   Yes Historical Provider, MD  sertraline (ZOLOFT) 50 MG tablet TAKE 1 TABLET BY MOUTH DAILY. 01/09/14  Yes Eulis FosterPadonda B Webb, FNP  triamcinolone cream (KENALOG) 0.1 % Apply 1 application topically 2 (  two) times daily. Patient taking differently: Apply 1 application topically 2 (two) times daily as needed (for ezcema.).  01/09/14  Yes Eulis Foster, FNP  triamcinolone ointment (KENALOG) 0.1 % Apply 1 application topically 2 (two) times daily. Patient taking differently: Apply 1 application topically 2 (two) times daily as needed (for ezcema).  01/09/14  Yes Eulis Foster, FNP    Physical Exam: Filed Vitals:   11/21/14 2202 11/22/14 0309 11/22/14 0323  BP: 125/72 117/70 138/70  Pulse: 72  70 85  Temp: 98.5 F (36.9 C)  99.1 F (37.3 C)  TempSrc: Oral  Oral  Resp: Height:    (1.651 m)  Weight:   59.149 kg (130 lb 6.4 oz)  SpO2: 100% 100% 100%    General: Alert, Awake and Oriented to Time, Place and Person. Appear in mild distress Eyes: PERRL ENT: Oral Mucosa clear moist. Neck: no JVD Cardiovascular: S1 and S2 Present, no Murmur, Peripheral Pulses Present Respiratory: Bilateral Air entry equal and Decreased,  Clear to Auscultation, no Crackles, no wheezes Abdomen: Bowel Sound present, Soft and diffusely tender Skin: no Rash Extremities: no Pedal edema, no calf tenderness Neurologic: Grossly no focal neuro deficit.  Labs on Admission:  CBC:  Recent Labs Lab 11/21/14 2304 11/22/14 0420  WBC 12.3* 12.3*  NEUTROABS 10.8*  --   HGB 13.9 13.3  HCT 39.6 38.4  MCV 94.5 96.0  PLT 234 205    CMP     Component Value Date/Time   NA 134* 11/22/2014 0420   K 3.6 11/22/2014 0420   CL 104 11/22/2014 0420   CO2 21* 11/22/2014 0420   GLUCOSE 164* 11/22/2014 0420   BUN 7 11/22/2014 0420   CREATININE 0.58 11/22/2014 0420   CALCIUM 7.7* 11/22/2014 0420   PROT 6.6 11/22/2014 0420   ALBUMIN 3.2* 11/22/2014 0420   AST 18 11/22/2014 0420   ALT 13* 11/22/2014 0420   ALKPHOS 45 11/22/2014 0420   BILITOT 0.6 11/22/2014 0420   GFRNONAA >60 11/22/2014 0420   GFRAA >60 11/22/2014 0420    No results for input(s): LIPASE, AMYLASE in the last 168 hours.  No results for input(s): CKTOTAL, CKMB, CKMBINDEX, TROPONINI in the last 168 hours. BNP (last 3 results) No results for input(s): BNP in the last 8760 hours.  ProBNP (last 3 results) No results for input(s): PROBNP in the last 8760 hours.   Radiological Exams on Admission: Ct Abdomen Pelvis W Contrast  11/22/2014   CLINICAL DATA:  Acute onset of generalized abdominal pain, nausea, vomiting and diarrhea. Blood in stools. Leukocytosis. Microhematuria. Initial encounter.  EXAM: CT ABDOMEN AND PELVIS WITH  CONTRAST  TECHNIQUE: Multidetector CT imaging of the abdomen and pelvis was performed using the standard protocol following bolus administration of intravenous contrast.  CONTRAST:  OMNIPAQUE IOHEXOL 300 MG/ML  SOLN  COMPARISON:  None.  FINDINGS: The visualized lung bases are clear.  The liver and spleen are unremarkable in appearance. The gallbladder is within normal limits. The pancreas and adrenal glands are unremarkable.  The kidneys are unremarkable in appearance. There is no evidence of hydronephrosis. No renal or ureteral stones are seen. No perinephric stranding is appreciated.  No free fluid is identified. The small bowel is unremarkable in appearance. The stomach is within normal limits. No acute vascular abnormalities are seen.  The appendix is normal in caliber, without evidence of appendicitis.  There is significant wall thickening along the ascending, transverse and descending colon, with surrounding soft  tissue inflammation and trace fluid, compatible with infectious or inflammatory colitis. Underlying vasculature appears prominent, reflecting hyperemia, without evidence of ischemia.  The bladder is mildly distended and grossly unremarkable in appearance. The uterus is unremarkable in appearance. A vaginal ring is noted in expected position. The ovaries are grossly symmetric and unremarkable in appearance. No inguinal lymphadenopathy is seen.  No acute osseous abnormalities are identified.  IMPRESSION: Significant acute colitis involving the ascending, transverse and descending colon, with diffuse wall thickening, surrounding soft tissue inflammation and trace fluid. This is likely infectious or inflammatory in nature. Underlying hyperemia noted.   Electronically Signed   By: Roanna Raider M.D.   On: 11/22/2014 01:48   Assessment/Plan Principal Problem:   Acute colitis Active Problems:   Pure hypercholesterolemia   Allergic rhinitis   Depression   1. Acute colitis The patient is  presenting with complains of abdominal pain with multiple episodes of vomiting and diarrhea and today episodes with bright red blood per rectum. A CT scan of the abdomen is showing pancolitis. Most likely inflammatory or lesion. I will check lactic acid levels he is on a CMP. Recheck CBC and CMP as well. GI consult in the morning. Patient remains nothing by mouth except medications. Gentle hydration and Cipro and Flagyl. GI pathogen panel sent out.  2. History of depression. Continuing Zoloft.  3. History of dyslipidemia. Currently holding statin.  4. History of allergy. Continuing antihistaminic  Advance goals of care discussion: Full code   DVT Prophylaxis: mechanical compression device Nutrition: Nothing by mouth except medications  Disposition: Admitted as inpatient, med-surge unit.  Author: Lynden Oxford, MD [facility] Pager: [phone] 11/22/2014  If 7PM-7AM, please contact night-coverage [email or web address] Password [email or web address]

## 2014-11-22 NOTE — ED Provider Notes (Signed)
Care assumed from prior team.  Pt awaiting CT scan.  Bloody stools, moderate to severe abdominal pain.  Results for orders placed or performed during the hospital encounter of 11/21/14  CBC with Differential  Result Value Ref Range   WBC 12.3 (H) 4.0 - 10.5 K/uL   RBC 4.19 3.87 - 5.11 MIL/uL   Hemoglobin 13.9 12.0 - 15.0 g/dL   HCT 21.3 08.6 - 57.8 %   MCV 94.5 78.0 - 100.0 fL   MCH 33.2 26.0 - 34.0 pg   MCHC 35.1 30.0 - 36.0 g/dL   RDW 46.9 62.9 - 52.8 %   Platelets 234 150 - 400 K/uL   Neutrophils Relative % 88 (H) 43 - 77 %   Neutro Abs 10.8 (H) 1.7 - 7.7 K/uL   Lymphocytes Relative 6 (L) 12 - 46 %   Lymphs Abs 0.8 0.7 - 4.0 K/uL   Monocytes Relative 6 3 - 12 %   Monocytes Absolute 0.7 0.1 - 1.0 K/uL   Eosinophils Relative 0 0 - 5 %   Eosinophils Absolute 0.0 0.0 - 0.7 K/uL   Basophils Relative 0 0 - 1 %   Basophils Absolute 0.0 0.0 - 0.1 K/uL  Comprehensive metabolic panel  Result Value Ref Range   Sodium 135 135 - 145 mmol/L   Potassium 3.4 (L) 3.5 - 5.1 mmol/L   Chloride 101 101 - 111 mmol/L   CO2 21 (L) 22 - 32 mmol/L   Glucose, Bld 147 (H) 65 - 99 mg/dL   BUN 10 6 - 20 mg/dL   Creatinine, Ser 4.13 0.44 - 1.00 mg/dL   Calcium 9.0 8.9 - 24.4 mg/dL   Total Protein 7.5 6.5 - 8.1 g/dL   Albumin 3.8 3.5 - 5.0 g/dL   AST 21 15 - 41 U/L   ALT 16 14 - 54 U/L   Alkaline Phosphatase 50 38 - 126 U/L   Total Bilirubin 0.8 0.3 - 1.2 mg/dL   GFR calc non Af Amer >60 >60 mL/min   GFR calc Af Amer >60 >60 mL/min   Anion gap 13 5 - 15  Urinalysis, Routine w reflex microscopic (not at [facility])  Result Value Ref Range   Color, Urine YELLOW YELLOW   APPearance TURBID (A) CLEAR   Specific Gravity, Urine 1.036 (H) 1.005 - 1.030   pH 6.0 5.0 - 8.0   Glucose, UA NEGATIVE NEGATIVE mg/dL   Hgb urine dipstick TRACE (A) NEGATIVE   Bilirubin Urine NEGATIVE NEGATIVE   Ketones, ur >80 (A) NEGATIVE mg/dL   Protein, ur NEGATIVE NEGATIVE mg/dL   Urobilinogen, UA 0.2 0.0 - 1.0 mg/dL   Nitrite  NEGATIVE NEGATIVE   Leukocytes, UA NEGATIVE NEGATIVE  Pregnancy, urine  Result Value Ref Range   Preg Test, Ur NEGATIVE NEGATIVE  Urine microscopic-add on  Result Value Ref Range   Bacteria, UA MANY (A) RARE   Urine-Other URINALYSIS PERFORMED ON SUPERNATANT   POC occult blood, ED  Result Value Ref Range   Fecal Occult Bld POSITIVE (A) NEGATIVE   Ct Abdomen Pelvis W Contrast  11/22/2014   CLINICAL DATA:  Acute onset of generalized abdominal pain, nausea, vomiting and diarrhea. Blood in stools. Leukocytosis. Microhematuria. Initial encounter.  EXAM: CT ABDOMEN AND PELVIS WITH CONTRAST  TECHNIQUE: Multidetector CT imaging of the abdomen and pelvis was performed using the standard protocol following bolus administration of intravenous contrast.  CONTRAST:  OMNIPAQUE IOHEXOL 300 MG/ML  SOLN  COMPARISON:  None.  FINDINGS: The visualized lung  bases are clear.  The liver and spleen are unremarkable in appearance. The gallbladder is within normal limits. The pancreas and adrenal glands are unremarkable.  The kidneys are unremarkable in appearance. There is no evidence of hydronephrosis. No renal or ureteral stones are seen. No perinephric stranding is appreciated.  No free fluid is identified. The small bowel is unremarkable in appearance. The stomach is within normal limits. No acute vascular abnormalities are seen.  The appendix is normal in caliber, without evidence of appendicitis.  There is significant wall thickening along the ascending, transverse and descending colon, with surrounding soft tissue inflammation and trace fluid, compatible with infectious or inflammatory colitis. Underlying vasculature appears prominent, reflecting hyperemia, without evidence of ischemia.  The bladder is mildly distended and grossly unremarkable in appearance. The uterus is unremarkable in appearance. A vaginal ring is noted in expected position. The ovaries are grossly symmetric and unremarkable in appearance. No  inguinal lymphadenopathy is seen.  No acute osseous abnormalities are identified.  IMPRESSION: Significant acute colitis involving the ascending, transverse and descending colon, with diffuse wall thickening, surrounding soft tissue inflammation and trace fluid. This is likely infectious or inflammatory in nature. Underlying hyperemia noted.   Electronically Signed   By: Roanna RaiderJeffery  Chang M.D.   On: 11/22/2014 01:48    Pt to receive cipro/flagyl and be admitted to hospitalist service.  Marisa Severinlga Etai Copado, MD 11/22/14 Earle [id number]

## 2014-11-22 NOTE — Progress Notes (Signed)
ANTIBIOTIC CONSULT NOTE - INITIAL  Pharmacy Consult for cipro Indication: Intra-abdominal infection  No Known Allergies  Patient Measurements: Height: 5\' 5"  (165.1 cm) Weight: 130 lb 6.4 oz (59.149 kg) IBW/kg (Calculated) : 57 Adjusted Body Weight:   Vital Signs: Temp: 99.1 F (37.3 C) (07/08 0323) Temp Source: Oral (07/08 0323) BP: 138/70 mmHg (07/08 0323) Pulse Rate: 85 (07/08 0323) Intake/Output from previous day:   Intake/Output from this shift:    Labs:  Recent Labs  11/21/14 2304  WBC 12.3*  HGB 13.9  PLT 234  CREATININE 0.73   Estimated Creatinine Clearance: 88.3 mL/min (by C-G formula based on Cr of 0.73). No results for input(s): VANCOTROUGH, VANCOPEAK, VANCORANDOM, GENTTROUGH, GENTPEAK, GENTRANDOM, TOBRATROUGH, TOBRAPEAK, TOBRARND, AMIKACINPEAK, AMIKACINTROU, AMIKACIN in the last 72 hours.   Microbiology: No results found for this or any previous visit (from the past 720 hour(s)).  Medical History: Past Medical History  Diagnosis Date  . Depression   . Allergy   . Hyperlipidemia   . UTI (lower urinary tract infection)     Medications:  Anti-infectives    Start     Dose/Rate Route Frequency Ordered Stop   11/22/14 1800  ciprofloxacin (CIPRO) IVPB 400 mg     400 mg 200 mL/hr over 60 Minutes Intravenous Every 12 hours 11/22/14 0400     11/22/14 1400  metroNIDAZOLE (FLAGYL) IVPB 500 mg     500 mg 100 mL/hr over 60 Minutes Intravenous Every 8 hours 11/22/14 0347     11/22/14 0215  metroNIDAZOLE (FLAGYL) IVPB 500 mg     500 mg 100 mL/hr over 60 Minutes Intravenous  Once 11/22/14 0201     11/22/14 0215  ciprofloxacin (CIPRO) IVPB 400 mg     400 mg 200 mL/hr over 60 Minutes Intravenous  Once 11/22/14 0201       Assessment: Patient with colitis, First dose of antibiotics already given.  Goal of Therapy:  Cipro dosed based on patient weight and renal function   Plan:  Follow up culture results  Cipro 400mg  iv q12hr  Aleene DavidsonGrimsley Jr, Zeynep Fantroy  [place] 11/22/2014,4:01 AM

## 2014-11-22 NOTE — Progress Notes (Signed)
34 year old lady admitted for pancolitis, on IV antibiotics, IV fluids and pain meds.  Evaluate for c diff and GI pathogen pcr .  GI consulted.  See Dr Eliane DecreePatel's note earlier for detailed H&p.    Brianna Modyvijaya Gaige Fussner, MD [phone]

## 2014-11-22 NOTE — Consult Note (Signed)
Referring Provider: No ref. provider found Primary Care Physician:  Eulis Foster, FNP Primary Gastroenterologist:  None, unassigned ? [place] primary  Reason for Consultation:  Colitis  HPI: Arli Bree is a 34 y.o. female with PMH of dyslipidemia, depression, seasonal allergies.  Had sudden onset of diarrhea (20 times a day) and nausea/vomiting 4 days ago.  Yesterday developed abdominal pain diffusely and started passing red blood with her stool.  Came to the ED at [facility] hospital.  CT scan showed the following:  IMPRESSION: Significant acute colitis involving the ascending, transverse and descending colon, with diffuse wall thickening, surrounding soft tissue inflammation and trace fluid. This is likely infectious or inflammatory in nature. Underlying hyperemia noted.  WBC count slightly elevated at 12.  Was started on IV cipro and flagyl.  Stool GI pathogen panel and culture were ordered and are pending.  She is a Engineer, civil (consulting).  Denies any antibiotic use or recent travel other than to [facility] for work.  Never had any similar symptoms in the past.  Prior to the onset of these symptoms she was feeling fine with normal bowel movements.  No GI complaints.  Says that she had some issues with diarrhea a couple of years ago but not this severe and it resolved on its own; she thought it was probably IBS.   Past Medical History  Diagnosis Date  . Depression   . Allergy   . Hyperlipidemia   . UTI (lower urinary tract infection)     Past Surgical History  Procedure Laterality Date  . Wisdom tooth extraction    . Refractive surgery    . Eye surgery      right eye    Prior to Admission medications   Medication Sig Start Date End Date Taking? Authorizing Provider  Armodafinil (NUVIGIL) 150 MG tablet Take 75-150 mg by mouth daily as needed (for alertness).   Yes Historical Provider, MD  atorvastatin (LIPITOR) 10 MG tablet Take 1 tablet (10 mg total) by mouth daily. 05/20/14  Yes Eulis Foster,  FNP  etonogestrel-ethinyl estradiol (NUVARING) 0.12-0.015 MG/24HR vaginal ring USE AS DIRECTED 01/09/14  Yes Eulis Foster, FNP  fexofenadine (ALLEGRA) 180 MG tablet Take 180 mg by mouth daily.   Yes Historical Provider, MD  folic acid (FOLVITE) 800 MCG tablet Take 400 mcg by mouth daily.   Yes Historical Provider, MD  mometasone (NASONEX) 50 MCG/ACT nasal spray INSTILL 2 SPRAYS INTO THE NOSE DAILY. 11/11/14  Yes Eulis Foster, FNP  Multiple Vitamin (MULTIVITAMIN WITH MINERALS) TABS tablet Take 1 tablet by mouth daily. One-A-Day Women's Vitamin.   Yes Historical Provider, MD  Naphazoline-Pheniramine (OPCON-A OP) Apply 1 drop to eye 3 (three) times daily as needed (for eye irritation).   Yes Historical Provider, MD  sertraline (ZOLOFT) 50 MG tablet TAKE 1 TABLET BY MOUTH DAILY. 01/09/14  Yes Eulis Foster, FNP  triamcinolone cream (KENALOG) 0.1 % Apply 1 application topically 2 (two) times daily. Patient taking differently: Apply 1 application topically 2 (two) times daily as needed (for ezcema.).  01/09/14  Yes Eulis Foster, FNP  triamcinolone ointment (KENALOG) 0.1 % Apply 1 application topically 2 (two) times daily. Patient taking differently: Apply 1 application topically 2 (two) times daily as needed (for ezcema).  01/09/14  Yes Eulis Foster, FNP    Current Facility-Administered Medications  Medication Dose Route Frequency Provider Last Rate Last Dose  . 0.9 %  sodium chloride infusion   Intravenous Continuous Rolly Salter, MD  100 mL/hr at 11/22/14 2956    . acetaminophen (TYLENOL) tablet 650 mg  650 mg Oral Q6H PRN Rolly Salter, MD       Or  . acetaminophen (TYLENOL) suppository 650 mg  650 mg Rectal Q6H PRN Rolly Salter, MD      . ciprofloxacin (CIPRO) IVPB 400 mg  400 mg Intravenous Q12H Rolly Salter, MD      . fluticasone (FLONASE) 50 MCG/ACT nasal spray 2 spray  2 spray Each Nare Daily Rolly Salter, MD      . HYDROmorphone (DILAUDID) injection 0.5 mg  0.5 mg Intravenous Q3H  PRN Rolly Salter, MD   0.5 mg at 11/22/14 2130  . loratadine (CLARITIN) tablet 10 mg  10 mg Oral Daily Rolly Salter, MD      . metroNIDAZOLE (FLAGYL) IVPB 500 mg  500 mg Intravenous Q8H Rolly Salter, MD      . ondansetron [facility]) tablet 4 mg  4 mg Oral Q6H PRN Rolly Salter, MD       Or  . ondansetron [facility]) injection 4 mg  4 mg Intravenous Q6H PRN Rolly Salter, MD   4 mg at 11/22/14 0411  . sertraline (ZOLOFT) tablet 50 mg  50 mg Oral Daily Rolly Salter, MD        Allergies as of 11/21/2014  . (No Known Allergies)    Family History  Problem Relation Age of Onset  . Sudden death Mother     suicide  . Depression Mother   . Arthritis Father     RA    History   Social History  . Marital Status: Single    Spouse Name: N/A  . Number of Children: N/A  . Years of Education: N/A   Occupational History  . Not on file.   Social History Main Topics  . Smoking status: Never Smoker   . Smokeless tobacco: Not on file  . Alcohol Use: Yes  . Drug Use: No  . Sexual Activity: Yes    Birth Control/ Protection: Other-see comments   Other Topics Concern  . Not on file   Social History Narrative    Review of Systems: Ten point ROS is O/W negative except as mentioned in HPI.  Physical Exam: Vital signs in last 24 hours: Temp:  [98.5 F (36.9 C)-99.1 F (37.3 C)] 99.1 F (37.3 C) (07/08 0323) Pulse Rate:  [70-85] 85 (07/08 0323) Resp:  [17-20] 20 (07/08 0323) BP: (117-138)/(70-72) 138/70 mmHg (07/08 0323) SpO2:  [100 %] 100 % (07/08 0323) Weight:  [130 lb 6.4 oz (59.149 kg)] 130 lb 6.4 oz (59.149 kg) (07/08 0323) Last BM Date: 11/22/14 General:  Alert, Well-developed, well-nourished, pleasant and cooperative in NAD Head:  Normocephalic and atraumatic. Eyes:  Sclera clear, no icterus.  Conjunctiva pink. Ears:  Normal auditory acuity. Mouth:  No deformity or lesions.   Lungs:  Clear throughout to auscultation.  No wheezes, crackles, or rhonchi.  Heart:  Regular  rate and rhythm; no murmurs, clicks, rubs, or gallops. Abdomen:  Soft, non-distended.  BS present.  Diffusely TTP but no R/R/G.  Rectal:  Deferred  Msk:  Symmetrical without gross deformities. Pulses:  Normal pulses noted. Extremities:  Without clubbing or edema. Neurologic:  Alert and  oriented x4;  grossly normal neurologically. Skin:  Intact without significant lesions or rashes. Psych:  Alert and cooperative. Normal mood and affect.  Intake/Output from previous day: 07/07 0701 - 07/08 0700 In: 183.3 [  I.V.:183.3] Out: -   Lab Results:  Recent Labs  11/21/14 2304 11/22/14 0420  WBC 12.3* 12.3*  HGB 13.9 13.3  HCT 39.6 38.4  PLT 234 205   BMET  Recent Labs  11/21/14 2304 11/22/14 0420  NA 135 134*  K 3.4* 3.6  CL 101 104  CO2 21* 21*  GLUCOSE 147* 164*  BUN 10 7  CREATININE 0.73 0.58  CALCIUM 9.0 7.7*   LFT  Recent Labs  11/22/14 0420  PROT 6.6  ALBUMIN 3.2*  AST 18  ALT 13*  ALKPHOS 45  BILITOT 0.6   PT/INR  Recent Labs  11/22/14 0420  LABPROT 13.9  INR 1.05   Studies/Results: Ct Abdomen Pelvis W Contrast  11/22/2014   CLINICAL DATA:  Acute onset of generalized abdominal pain, nausea, vomiting and diarrhea. Blood in stools. Leukocytosis. Microhematuria. Initial encounter.  EXAM: CT ABDOMEN AND PELVIS WITH CONTRAST  TECHNIQUE: Multidetector CT imaging of the abdomen and pelvis was performed using the standard protocol following bolus administration of intravenous contrast.  CONTRAST:  OMNIPAQUE IOHEXOL 300 MG/ML  SOLN  COMPARISON:  None.  FINDINGS: The visualized lung bases are clear.  The liver and spleen are unremarkable in appearance. The gallbladder is within normal limits. The pancreas and adrenal glands are unremarkable.  The kidneys are unremarkable in appearance. There is no evidence of hydronephrosis. No renal or ureteral stones are seen. No perinephric stranding is appreciated.  No free fluid is identified. The small bowel is unremarkable  in appearance. The stomach is within normal limits. No acute vascular abnormalities are seen.  The appendix is normal in caliber, without evidence of appendicitis.  There is significant wall thickening along the ascending, transverse and descending colon, with surrounding soft tissue inflammation and trace fluid, compatible with infectious or inflammatory colitis. Underlying vasculature appears prominent, reflecting hyperemia, without evidence of ischemia.  The bladder is mildly distended and grossly unremarkable in appearance. The uterus is unremarkable in appearance. A vaginal ring is noted in expected position. The ovaries are grossly symmetric and unremarkable in appearance. No inguinal lymphadenopathy is seen.  No acute osseous abnormalities are identified.  IMPRESSION: Significant acute colitis involving the ascending, transverse and descending colon, with diffuse wall thickening, surrounding soft tissue inflammation and trace fluid. This is likely infectious or inflammatory in nature. Underlying hyperemia noted.   Electronically Signed   By: Roanna Raider M.D.   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  CT scan shows diffuse acute colitis.  Sounds infectious by history.  ? Cdiff colitis?  Stool studies pending.  PLAN: -Await results of stool studies.   -Continue IV cipro and flagyl for now along with IVF's, pain control, and anti-emetics.   ZEHR, JESSICA D.  11/22/2014, 9:25 AM  Pager number [phone]  GI ATTENDING  History, laboratories, CT scan, reviewed. Patient personally seen and examined. Agree with consultative note as outlined above. Abrupt onset of diarrhea followed by abdominal cramping and bloody diarrhea. CT scan with diffuse nonspecific colitis. Clinical presentation is most consistent with acute  bacterial colitis (e.g. Salmonella, Shigella, Yersinia). Stool pathogen panel pending. Need to rule out C. difficile, though this degree bleeding would be unusual. Unlikely that antibiotics are necessary, but okay for now. Treatment is symptomatic with hydration, medication for abdominal cramping discomfort. Diarrhea improving. Hopefully able to  go home soon if she is able tolerate diet and feels well enough. GI will follow.  Wilhemina BonitoJohn N. Eda KeysPerry, Jr., M.D. [facility] Division of Gastroenterology

## 2014-11-23 DIAGNOSIS — R1031 Right lower quadrant pain: Secondary | ICD-10-CM | POA: Insufficient documentation

## 2014-11-23 DIAGNOSIS — R1032 Left lower quadrant pain: Secondary | ICD-10-CM

## 2014-11-23 LAB — CLOSTRIDIUM DIFFICILE BY PCR: Toxigenic C. Difficile by PCR: NEGATIVE

## 2014-11-23 MED ORDER — PROMETHAZINE HCL 25 MG/ML IJ SOLN
12.5000 mg | Freq: Four times a day (QID) | INTRAMUSCULAR | Status: DC | PRN
  Administered 2014-11-23: 12.5 mg via INTRAVENOUS
  Filled 2014-11-23: qty 1

## 2014-11-23 NOTE — Progress Notes (Signed)
[facility] Progress Note  Subjective:  Diarrhea and bleeding getting better.  Still with quite a bit of abdominal pain and nausea.  Does not feel like eating yet, but would like to try some other clear liquids.  Objective:  Vital signs in last 24 hours: Temp:  [98.2 F (36.8 C)-98.4 F (36.9 C)] 98.4 F (36.9 C) (07/09 0703) Pulse Rate:  [75-93] 93 (07/09 0703) Resp:  [20] 20 (07/09 0703) BP: (107-119)/(61-71) 114/65 mmHg (07/09 0703) SpO2:  [98 %-100 %] 100 % (07/09 0703) Last BM Date: 11/22/14 General:  Alert, Well-developed, in NAD Heart:  Regular rate and rhythm; no murmurs Pulm:  CTAB.  No W/R/R. Abdomen:  Soft, non-distended.  BS present.  Diffuse moderate TTP without R/R/G. Extremities:  Without edema. Neurologic:  Alert and  oriented x4;  grossly normal neurologically. Psych:  Alert and cooperative. Normal mood and affect.  Intake/Output from previous day: 07/08 0701 - 07/09 0700 In: 2400 [I.V.:2400] Out: 600 [Urine:600] Intake/Output this shift: Total I/O In: -  Out: 100 [Urine:100]  Lab Results:  Recent Labs  11/21/14 2304 11/22/14 0420  WBC 12.3* 12.3*  HGB 13.9 13.3  HCT 39.6 38.4  PLT 234 205   BMET  Recent Labs  11/21/14 2304 11/22/14 0420  NA 135 134*  K 3.4* 3.6  CL 101 104  CO2 21* 21*  GLUCOSE 147* 164*  BUN 10 7  CREATININE 0.73 0.58  CALCIUM 9.0 7.7*   LFT  Recent Labs  11/22/14 0420  PROT 6.6  ALBUMIN 3.2*  AST 18  ALT 13*  ALKPHOS 45  BILITOT 0.6   PT/INR  Recent Labs  11/22/14 0420  LABPROT 13.9  INR 1.05   Ct Abdomen Pelvis W Contrast  11/22/2014   CLINICAL DATA:  Acute onset of generalized abdominal pain, nausea, vomiting and diarrhea. Blood in stools. Leukocytosis. Microhematuria. Initial encounter.  EXAM: CT ABDOMEN AND PELVIS WITH CONTRAST  TECHNIQUE: Multidetector CT imaging of the abdomen and pelvis was performed using the standard protocol following bolus administration of intravenous  contrast.  CONTRAST:  OMNIPAQUE IOHEXOL 300 MG/ML  SOLN  COMPARISON:  None.  FINDINGS: The visualized lung bases are clear.  The liver and spleen are unremarkable in appearance. The gallbladder is within normal limits. The pancreas and adrenal glands are unremarkable.  The kidneys are unremarkable in appearance. There is no evidence of hydronephrosis. No renal or ureteral stones are seen. No perinephric stranding is appreciated.  No free fluid is identified. The small bowel is unremarkable in appearance. The stomach is within normal limits. No acute vascular abnormalities are seen.  The appendix is normal in caliber, without evidence of appendicitis.  There is significant wall thickening along the ascending, transverse and descending colon, with surrounding soft tissue inflammation and trace fluid, compatible with infectious or inflammatory colitis. Underlying vasculature appears prominent, reflecting hyperemia, without evidence of ischemia.  The bladder is mildly distended and grossly unremarkable in appearance. The uterus is unremarkable in appearance. A vaginal ring is noted in expected position. The ovaries are grossly symmetric and unremarkable in appearance. No inguinal lymphadenopathy is seen.  No acute osseous abnormalities are identified.  IMPRESSION: Significant acute colitis involving the ascending, transverse and descending colon, with diffuse wall thickening, surrounding soft tissue inflammation and trace fluid. This is likely infectious or inflammatory in nature. Underlying hyperemia noted.   Electronically Signed   By: Roanna Raider M.D.   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 CT scan shows diffuse acute colitis. Sounds infectious by history.Cdiff negative.  Other stool studies pending.  ? Bacterial colitis such  as Salmonella, shigella, etc.  -Await results of remaining stool studies.  -Continue IV cipro and flagyl for now along with IVF's, pain control, and anti-emetics. -Will give clear liquids and diet can be advanced as tolerated.    LOS: 1 day   ZEHR, JESSICA D.  11/23/2014, 8:38 AM  Pager number [phone]  GI ATTENDING  Interval history and data reviewed. Patient personally seen. Agree with interval progress note as outlined above. Improving with less diarrhea and resolving bleeding. Principal problem is abdominal pain. Continue IV hydration, pain control, and advancement of diet as tolerated.  Wilhemina BonitoJohn N. Eda KeysPerry, Jr., M.D. [facility] of [facility]enterology

## 2014-11-23 NOTE — Progress Notes (Signed)
[facility] PROGRESS NOTE  Brianna Mckinney ZOX:[id number] DOB: Mar 15, 1981 DOA: 11/21/2014 PCP: Eulis Foster, FNP  Assessment/Plan: 1. Pan colitis: - admitted for IV antibiotics/ IV fluids and IV anti emetics. c diff pcr is negative. GI pathogen is pending.  CT abdomen showed pancolitis.   2. Leukocytosis: probably from pan colitis.   Code Status: full code.  Family Communication: none at bedside) Disposition Plan: pending.    Consultants:  GI  Procedures:  NONE  Antibiotics:  FLAGYL  CIPRO 7/8  HPI/Subjective: DIARRHEA and vomiting improved.   Objective: Filed Vitals:   11/23/14 1400  BP: 116/58  Pulse: 80  Temp: 97.9 F (36.6 C)  Resp: 16    Intake/Output Summary (Last 24 hours) at 11/23/14 1828 Last data filed at 11/23/14 1700  Gross per 24 hour  Intake   1600 ml  Output   1700 ml  Net   -100 ml   Filed Weights   11/22/14 0323  Weight: 59.149 kg (130 lb 6.4 oz)    Exam:   General:  Alert afebrile comfortable  Cardiovascular: s1s2  Respiratory: ctab  Abdomen: soft moderate tenderness.  ND BS+  Musculoskeletal: no pedal edema.   Data Reviewed: Basic Metabolic Panel:  Recent Labs Lab 11/21/14 2304 11/22/14 0420  NA 135 134*  K 3.4* 3.6  CL 101 104  CO2 21* 21*  GLUCOSE 147* 164*  BUN 10 7  CREATININE 0.73 0.58  CALCIUM 9.0 7.7*   Liver Function Tests:  Recent Labs Lab 11/21/14 2304 11/22/14 0420  AST 21 18  ALT 16 13*  ALKPHOS 50 45  BILITOT 0.8 0.6  PROT 7.5 6.6  ALBUMIN 3.8 3.2*   No results for input(s): LIPASE, AMYLASE in the last 168 hours. No results for input(s): AMMONIA in the last 168 hours. CBC:  Recent Labs Lab 11/21/14 2304 11/22/14 0420  WBC 12.3* 12.3*  NEUTROABS 10.8*  --   HGB 13.9 13.3  HCT 39.6 38.4  MCV 94.5 96.0  PLT 234 205   Cardiac Enzymes: No results for input(s): CKTOTAL, CKMB, CKMBINDEX, TROPONINI in the last 168 hours. BNP (last 3 results) No results for input(s): BNP in  the last 8760 hours.  ProBNP (last 3 results) No results for input(s): PROBNP in the last 8760 hours.  CBG: No results for input(s): GLUCAP in the last 168 hours.  Recent Results (from the past 240 hour(s))  Stool culture     Status: None (Preliminary result)   Collection Time: 11/22/14  5:05 AM  Result Value Ref Range Status   Specimen Description STOOL  Final   Special Requests NONE  Final   Culture   Final    Culture reincubated for better growth Performed at [facility]    Report Status PENDING  Incomplete  Clostridium Difficile by PCR (not at [facility])     Status: None   Collection Time: 11/23/14 12:30 AM  Result Value Ref Range Status   C difficile by pcr NEGATIVE NEGATIVE Final     Studies: Ct Abdomen Pelvis W Contrast  11/22/2014   CLINICAL DATA:  Acute onset of generalized abdominal pain, nausea, vomiting and diarrhea. Blood in stools. Leukocytosis. Microhematuria. Initial encounter.  EXAM: CT ABDOMEN AND PELVIS WITH CONTRAST  TECHNIQUE: Multidetector CT imaging of the abdomen and pelvis was performed using the standard protocol following bolus administration of intravenous contrast.  CONTRAST:  OMNIPAQUE IOHEXOL 300 MG/ML  SOLN  COMPARISON:  None.  FINDINGS: The visualized lung bases are clear.  The liver and spleen are unremarkable in appearance. The gallbladder is within normal limits. The pancreas and adrenal glands are unremarkable.  The kidneys are unremarkable in appearance. There is no evidence of hydronephrosis. No renal or ureteral stones are seen. No perinephric stranding is appreciated.  No free fluid is identified. The small bowel is unremarkable in appearance. The stomach is within normal limits. No acute vascular abnormalities are seen.  The appendix is normal in caliber, without evidence of appendicitis.  There is significant wall thickening along the ascending, transverse and descending colon, with surrounding soft tissue inflammation and trace fluid,  compatible with infectious or inflammatory colitis. Underlying vasculature appears prominent, reflecting hyperemia, without evidence of ischemia.  The bladder is mildly distended and grossly unremarkable in appearance. The uterus is unremarkable in appearance. A vaginal ring is noted in expected position. The ovaries are grossly symmetric and unremarkable in appearance. No inguinal lymphadenopathy is seen.  No acute osseous abnormalities are identified.  IMPRESSION: Significant acute colitis involving the ascending, transverse and descending colon, with diffuse wall thickening, surrounding soft tissue inflammation and trace fluid. This is likely infectious or inflammatory in nature. Underlying hyperemia noted.   Electronically Signed   By: Roanna RaiderJeffery  Chang M.D.   On: 11/22/2014 01:48    Scheduled Meds: . ciprofloxacin  400 mg Intravenous Q12H  . fluticasone  2 spray Each Nare Daily  . loratadine  10 mg Oral Daily  . metronidazole  500 mg Intravenous Q8H  . sertraline  50 mg Oral Daily   Continuous Infusions: . sodium chloride 100 mL/hr at 11/23/14 16100635    Principal Problem:   Acute colitis Active Problems:   Pure hypercholesterolemia   Allergic rhinitis   Depression    Time spent: 25 minutes.     [facility] Pl Surgery CenterKULA,Brianna Mckinney  [facility] Pager [phone]  If 7PM-7AM, please contact night-coverage at [email or web address], password [email or web address] 11/23/2014, 6:28 PM  LOS: 1 day

## 2014-11-24 DIAGNOSIS — R1032 Left lower quadrant pain: Secondary | ICD-10-CM

## 2014-11-24 DIAGNOSIS — R1031 Right lower quadrant pain: Secondary | ICD-10-CM

## 2014-11-24 LAB — BASIC METABOLIC PANEL
Anion gap: 6 (ref 5–15)
BUN: <5 mg/dL — ABNORMAL LOW (ref 6–20)
CALCIUM: 7.8 mg/dL — AB (ref 8.9–10.3)
CO2: 27 mmol/L (ref 22–32)
CREATININE: 0.58 mg/dL (ref 0.44–1.00)
Chloride: 99 mmol/L — ABNORMAL LOW (ref 101–111)
Glucose, Bld: 100 mg/dL — ABNORMAL HIGH (ref 65–99)
Potassium: 2.9 mmol/L — ABNORMAL LOW (ref 3.5–5.1)
Sodium: 132 mmol/L — ABNORMAL LOW (ref 135–145)

## 2014-11-24 LAB — MAGNESIUM: Magnesium: 1.8 mg/dL (ref 1.7–2.4)

## 2014-11-24 MED ORDER — POTASSIUM CHLORIDE CRYS ER 20 MEQ PO TBCR
40.0000 meq | EXTENDED_RELEASE_TABLET | Freq: Once | ORAL | Status: AC
  Administered 2014-11-24: 40 meq via ORAL
  Filled 2014-11-24: qty 2

## 2014-11-24 MED ORDER — POTASSIUM CHLORIDE 10 MEQ/100ML IV SOLN
10.0000 meq | INTRAVENOUS | Status: AC
  Administered 2014-11-24: 10 meq via INTRAVENOUS
  Filled 2014-11-24 (×2): qty 100

## 2014-11-24 NOTE — Progress Notes (Signed)
     [facility] Progress Note  Subjective:  Still has some diarrhea but bleeding is resolving.  Abdominal pain still biggest complaint.  Up moving around some and getting cleaned up today.  Will try full liquids today.  Objective:  Vital signs in last 24 hours: Temp:  [97.9 F (36.6 C)-99.7 F (37.6 C)] 98.3 F (36.8 C) (07/10 0446) Pulse Rate:  [69-80] 69 (07/10 0446) Resp:  [16-20] 20 (07/10 0446) BP: (100-116)/(58-64) 100/60 mmHg (07/10 0446) SpO2:  [100 %] 100 % (07/10 0446) Last BM Date: 11/24/14 General:  Alert, Well-developed, in NAD Heart:  Regular rate and rhythm; no murmurs Pulm:  CTAB.  No W/R/R. Abdomen:  Soft, non-distended. Hyperactive bowel sounds.  Diffuse TTP.  Extremities:  Without edema. Neurologic:  Alert and  oriented x4;  grossly normal neurologically. Psych:  Alert and cooperative. Normal mood and affect.  Intake/Output from previous day: 07/09 0701 - 07/10 0700 In: 800 [I.V.:800] Out: 1100 [Urine:1100]  Lab Results:  Recent Labs  11/21/14 2304 11/22/14 0420  WBC 12.3* 12.3*  HGB 13.9 13.3  HCT 39.6 38.4  PLT 234 205   BMET  Recent Labs  11/21/14 2304 11/22/14 0420  NA 135 134*  K 3.4* 3.6  CL 101 104  CO2 21* 21*  GLUCOSE 147* 164*  BUN 10 7  CREATININE 0.73 0.58  CALCIUM 9.0 7.7*   LFT  Recent Labs  11/22/14 0420  PROT 6.6  ALBUMIN 3.2*  AST 18  ALT 13*  ALKPHOS 45  BILITOT 0.6   PT/INR  Recent Labs  11/22/14 0420  LABPROT 13.9  INR 1.05   Assessment / Plan: -Acute onset of nausea, vomiting, diarrhea (initially non-bloody then progressed to bloody), and 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 CT scan shows diffuse acute colitis. Sounds infectious by history.Cdiff negative. Other stool studies pending. ? Bacterial colitis such as Salmonella, shigella, etc.  -Await results of remaining stool studies.  -Continue IV cipro and flagyl for now along with IVF's,  pain control, and anti-emetics. -Will give full liquids today and can be advanced as tolerated.   LOS: 2 days   ZEHR, JESSICA D.  11/24/2014, 8:45 AM  Pager number [phone]  GI ATTENDING  Interval history data reviewed. Patient seen and examined personally. Agree with above interval progress note. She looks much better today. Improve the parameters still with some lower abdominal discomfort. Bleeding has resolved. GI pathogen panel pending. On Cipro and Flagyl. Recommend advancing diet as tolerated. Increased activity. She could use anti-spasmodic's were abdominal pain. I imagine that she can go home tomorrow if better or no worse than today. She should stay out of work for about one week.  Wilhemina BonitoJohn N. Eda KeysPerry, Jr., M.D. [facility]

## 2014-11-24 NOTE — Progress Notes (Signed)
[facility] PROGRESS NOTE  Brianna Mckinney JXB:[id number] DOB: [birth year] DOA: 11/21/2014 PCP: Eulis FosterPadonda B Webb, FNP  Assessment/Plan: 1. Pan colitis: - admitted for IV antibiotics/ IV fluids and IV anti emetics. c diff pcr is negative. GI pathogen is pending.  CT abdomen showed pancolitis. Improving, started on clears . Advance as tolerated.  2. Leukocytosis: probably from pan colitis.   3. Hypokalemia: Replete as needed   Code Status: full code.  Family Communication: none at bedside) Disposition Plan: pending.    Consultants:  GI  Procedures:  NONE  Antibiotics:  FLAGYL  CIPRO 7/8  HPI/Subjective: DIARRHEA and vomiting improved. Pain has improved.  Objective: Filed Vitals:   11/24/14 1400  BP: 91/51  Pulse: 96  Temp: 98.6 F (37 C)  Resp: 18    Intake/Output Summary (Last 24 hours) at 11/24/14 1751 Last data filed at 11/24/14 1449  Gross per 24 hour  Intake   1400 ml  Output      0 ml  Net   1400 ml   Filed Weights   11/22/14 0323  Weight: 59.149 kg (130 lb 6.4 oz)    Exam:   General:  Alert afebrile comfortable  Cardiovascular: s1s2  Respiratory: ctab  Abdomen: soft mild tenderness.  ND BS+  Musculoskeletal: no pedal edema.   Data Reviewed: Basic Metabolic Panel:  Recent Labs Lab 11/21/14 2304 11/22/14 0420 11/24/14 1314  NA 135 134* 132*  K 3.4* 3.6 2.9*  CL 101 104 99*  CO2 21* 21* 27  GLUCOSE 147* 164* 100*  BUN 10 7 <5*  CREATININE 0.73 0.58 0.58  CALCIUM 9.0 7.7* 7.8*   Liver Function Tests:  Recent Labs Lab 11/21/14 2304 11/22/14 0420  AST 21 18  ALT 16 13*  ALKPHOS 50 45  BILITOT 0.8 0.6  PROT 7.5 6.6  ALBUMIN 3.8 3.2*   No results for input(s): LIPASE, AMYLASE in the last 168 hours. No results for input(s): AMMONIA in the last 168 hours. CBC:  Recent Labs Lab 11/21/14 2304 11/22/14 0420  WBC 12.3* 12.3*  NEUTROABS 10.8*  --   HGB 13.9 13.3  HCT 39.6 38.4  MCV 94.5 96.0  PLT 234 205    Cardiac Enzymes: No results for input(s): CKTOTAL, CKMB, CKMBINDEX, TROPONINI in the last 168 hours. BNP (last 3 results) No results for input(s): BNP in the last 8760 hours.  ProBNP (last 3 results) No results for input(s): PROBNP in the last 8760 hours.  CBG: No results for input(s): GLUCAP in the last 168 hours.  Recent Results (from the past 240 hour(s))  Stool culture     Status: None (Preliminary result)   Collection Time: 11/22/14  5:05 AM  Result Value Ref Range Status   Specimen Description STOOL  Final   Special Requests NONE  Final   Culture   Final    NO SUSPICIOUS COLONIES, CONTINUING TO HOLD Performed at [facility]    Report Status PENDING  Incomplete  Clostridium Difficile by PCR (not at [facility])     Status: None   Collection Time: 11/23/14 12:30 AM  Result Value Ref Range Status   C difficile by pcr NEGATIVE NEGATIVE Final     Studies: No results found.  Scheduled Meds: . ciprofloxacin  400 mg Intravenous Q12H  . fluticasone  2 spray Each Nare Daily  . loratadine  10 mg Oral Daily  . metronidazole  500 mg Intravenous Q8H  . potassium chloride  10 mEq Intravenous Q1 Hr x 2  .  potassium chloride  40 mEq Oral Once  . sertraline  50 mg Oral Daily   Continuous Infusions: . sodium chloride 100 mL/hr at 11/24/14 9147    Principal Problem:   Acute colitis Active Problems:   Pure hypercholesterolemia   Allergic rhinitis   Depression   Bilateral lower abdominal pain    Time spent: 25 minutes.     [facility]  [facility] Pager [phone]  If 7PM-7AM, please contact night-coverage at [email or web address], password [facility] 11/24/2014, 5:51 PM  LOS: 2 days

## 2014-11-25 LAB — BASIC METABOLIC PANEL
Anion gap: 4 — ABNORMAL LOW (ref 5–15)
CALCIUM: 7.5 mg/dL — AB (ref 8.9–10.3)
CO2: 27 mmol/L (ref 22–32)
Chloride: 103 mmol/L (ref 101–111)
Creatinine, Ser: 0.53 mg/dL (ref 0.44–1.00)
Glucose, Bld: 100 mg/dL — ABNORMAL HIGH (ref 65–99)
Potassium: 3.5 mmol/L (ref 3.5–5.1)
SODIUM: 134 mmol/L — AB (ref 135–145)

## 2014-11-25 MED ORDER — METRONIDAZOLE 500 MG PO TABS
500.0000 mg | ORAL_TABLET | Freq: Three times a day (TID) | ORAL | Status: DC
  Administered 2014-11-25 – 2014-11-26 (×4): 500 mg via ORAL
  Filled 2014-11-25 (×6): qty 1

## 2014-11-25 MED ORDER — CIPROFLOXACIN HCL 250 MG PO TABS
250.0000 mg | ORAL_TABLET | Freq: Two times a day (BID) | ORAL | Status: DC
  Administered 2014-11-25 – 2014-11-26 (×3): 250 mg via ORAL
  Filled 2014-11-25 (×5): qty 1

## 2014-11-25 MED ORDER — POTASSIUM CHLORIDE CRYS ER 20 MEQ PO TBCR
40.0000 meq | EXTENDED_RELEASE_TABLET | Freq: Once | ORAL | Status: AC
  Administered 2014-11-25: 40 meq via ORAL
  Filled 2014-11-25: qty 2

## 2014-11-25 MED ORDER — HYDROCODONE-ACETAMINOPHEN 5-325 MG PO TABS
1.0000 | ORAL_TABLET | ORAL | Status: DC | PRN
  Administered 2014-11-25: 1 via ORAL
  Administered 2014-11-25 (×2): 2 via ORAL
  Filled 2014-11-25 (×3): qty 2

## 2014-11-25 MED ORDER — HYOSCYAMINE SULFATE 0.125 MG SL SUBL
0.2500 mg | SUBLINGUAL_TABLET | Freq: Four times a day (QID) | SUBLINGUAL | Status: DC | PRN
  Administered 2014-11-25 (×2): 0.25 mg via SUBLINGUAL
  Filled 2014-11-25 (×3): qty 2

## 2014-11-25 NOTE — Progress Notes (Signed)
Patient unable to tolerate iv potassium. She had severe burning at iv site, with potassium going in very slowly. Hosptalist on call made aware. Orders received to discontinue iv potassium, and give 40 meq po Kdur. Patient made aware of new orders.

## 2014-11-25 NOTE — Progress Notes (Signed)
    Progress Note   Subjective  Overall diarrhea is better but still having it - two episodes so far this am.  Tolerating full liquids.    Objective   Vital signs in last 24 hours: Temp:  [98.1 F (36.7 C)-99.4 F (37.4 C)] 98.1 F (36.7 C) (07/11 0521) Pulse Rate:  [86-103] 86 (07/11 0521) Resp:  [16-18] 16 (07/11 0521) BP: (91-102)/(51-60) 102/60 mmHg (07/11 0521) SpO2:  [99 %-100 %] 99 % (07/11 0521) Last BM Date: 11/25/14 General:    white female in NAD Heart:  Regular rate and rhythm Abdomen:  Soft, nontender and nondistended. Normal bowel sounds. Extremities:  Without edema. Neurologic:  Alert and oriented,  grossly normal neurologically. Psych:  Cooperative. Normal mood and affect. BMET  Recent Labs  11/24/14 1314 11/25/14 0355  NA 132* 134*  K 2.9* 3.5  CL 99* 103  CO2 27 27  GLUCOSE 100* 100*  BUN <5* <5*  CREATININE 0.58 0.53  CALCIUM 7.8* 7.5*      Assessment / Plan:    34 year old female with acute colitis, improving on cipro and flagyl. Suspect infectious etiology. C-diff negative. Stool pathogen panel pending. Still having some diarrhea and abdominal pain. Will transition her to oral antibiotics and oral pain meds. Hopefully home this afternoon or in am.     LOS: 3 days   Willette Clusteraula Guenther  11/25/2014, 9:16 AM

## 2014-11-25 NOTE — Progress Notes (Signed)
ANTIBIOTIC CONSULT NOTE - follow up  Pharmacy Consult for cipro Indication: Intra-abdominal infection  No Known Allergies  Patient Measurements: Height: 5\' 5"  (165.1 cm) Weight: 130 lb 6.4 oz (59.149 kg) IBW/kg (Calculated) : 57 Adjusted Body Weight:   Vital Signs: Temp: 98.1 F (36.7 C) (07/11 0521) Temp Source: Oral (07/11 0521) BP: 102/60 mmHg (07/11 0521) Pulse Rate: 86 (07/11 0521) Intake/Output from previous day: 07/10 0701 - 07/11 0700 In: 960 [P.O.:960] Out: 250 [Urine:250] Intake/Output from this shift:    Labs:  Recent Labs  11/24/14 1314 11/25/14 0355  CREATININE 0.58 0.53   Estimated Creatinine Clearance: 88.3 mL/min (by C-G formula based on Cr of 0.53). No results for input(s): VANCOTROUGH, VANCOPEAK, VANCORANDOM, GENTTROUGH, GENTPEAK, GENTRANDOM, TOBRATROUGH, TOBRAPEAK, TOBRARND, AMIKACINPEAK, AMIKACINTROU, AMIKACIN in the last 72 hours.   Microbiology: Recent Results (from the past 720 hour(s))  Stool culture     Status: None (Preliminary result)   Collection Time: 11/22/14  5:05 AM  Result Value Ref Range Status   Specimen Description STOOL  Final   Special Requests NONE  Final   Culture   Final    NO SUSPICIOUS COLONIES, CONTINUING TO HOLD Performed at [facility]    Report Status PENDING  Incomplete  Clostridium Difficile by PCR (not at [facility])     Status: None   Collection Time: 11/23/14 12:30 AM  Result Value Ref Range Status   C difficile by pcr NEGATIVE NEGATIVE Final    Medical History: Past Medical History  Diagnosis Date  . Depression   . Allergy   . Hyperlipidemia   . UTI (lower urinary tract infection)     Medications:  Anti-infectives    Start     Dose/Rate Route Frequency Ordered Stop   11/22/14 1800  ciprofloxacin (CIPRO) IVPB 400 mg     400 mg 200 mL/hr over 60 Minutes Intravenous Every 12 hours 11/22/14 0400     11/22/14 1400  metroNIDAZOLE (FLAGYL) IVPB 500 mg     500 mg 100 mL/hr over 60 Minutes  Intravenous Every 8 hours 11/22/14 0347     11/22/14 0215  metroNIDAZOLE (FLAGYL) IVPB 500 mg     500 mg 100 mL/hr over 60 Minutes Intravenous  Once 11/22/14 0201 11/22/14 0422   11/22/14 0215  ciprofloxacin (CIPRO) IVPB 400 mg     400 mg 200 mL/hr over 60 Minutes Intravenous  Once 11/22/14 0201 11/22/14 0422     Assessment: 3 yoF to ED with sudden onset of diarrhea (20 times a day) and nausea/vomiting 4 days ago. 7/7 developed abdominal pain diffusely and started passing red blood with her stool. CT scan showed the following: acute colitis - infectious or inflammatory. Started on Cipro/Flagyl 7/8. Afebrile, WBC just above WNL, renal function stable  Goal of Therapy:  Cipro dosed based on patient weight and renal function   Plan:  1) Continue Cipro 400mg  IV q12 2) Will sign off but continue to follow at distance   Hessie KnowsJustin M Adaley Kiene, PharmD, BCPS Pager [phone] 11/25/2014 8:08 AM

## 2014-11-25 NOTE — Progress Notes (Signed)
[facility] PROGRESS NOTE  Vassie Kugel ZOX:[id number] DOB: 10/05/1980 DOA: 11/21/2014 PCP: Eulis Foster, FNP  Assessment/Plan: 1. Pan colitis: - admitted for IV antibiotics/ IV fluids and IV anti emetics. c diff pcr is negative. GI pathogen is pending.  CT abdomen showed pancolitis. Improving, started on clears . Advance as tolerated.  2. Leukocytosis: probably from pan colitis.   3. Hypokalemia: Replete as needed   Code Status: full code.  Family Communication: none at bedside) Disposition Plan: pending.    Consultants:  GI  Procedures:  NONE  Antibiotics:  FLAGYL  CIPRO 7/8  HPI/Subjective: DIARRHEA and vomiting improved. Pain has improved.  Objective: Filed Vitals:   11/25/14 1430  BP: 100/62  Pulse: 80  Temp: 98 F (36.7 C)  Resp: 18    Intake/Output Summary (Last 24 hours) at 11/25/14 1917 Last data filed at 11/25/14 1300  Gross per 24 hour  Intake    360 ml  Output    250 ml  Net    110 ml   Filed Weights   11/22/14 0323  Weight: 59.149 kg (130 lb 6.4 oz)    Exam:   General:  Alert afebrile comfortable  Cardiovascular: s1s2  Respiratory: ctab  Abdomen: soft mild tenderness.  ND BS+  Musculoskeletal: no pedal edema.   Data Reviewed: Basic Metabolic Panel:  Recent Labs Lab 11/21/14 2304 11/22/14 0420 11/24/14 1314 11/25/14 0355  NA 135 134* 132* 134*  K 3.4* 3.6 2.9* 3.5  CL 101 104 99* 103  CO2 21* 21* 27 27  GLUCOSE 147* 164* 100* 100*  BUN 10 7 <5* <5*  CREATININE 0.73 0.58 0.58 0.53  CALCIUM 9.0 7.7* 7.8* 7.5*  MG  --   --  1.8  --    Liver Function Tests:  Recent Labs Lab 11/21/14 2304 11/22/14 0420  AST 21 18  ALT 16 13*  ALKPHOS 50 45  BILITOT 0.8 0.6  PROT 7.5 6.6  ALBUMIN 3.8 3.2*   No results for input(s): LIPASE, AMYLASE in the last 168 hours. No results for input(s): AMMONIA in the last 168 hours. CBC:  Recent Labs Lab 11/21/14 2304 11/22/14 0420  WBC 12.3* 12.3*  NEUTROABS 10.8*   --   HGB 13.9 13.3  HCT 39.6 38.4  MCV 94.5 96.0  PLT 234 205   Cardiac Enzymes: No results for input(s): CKTOTAL, CKMB, CKMBINDEX, TROPONINI in the last 168 hours. BNP (last 3 results) No results for input(s): BNP in the last 8760 hours.  ProBNP (last 3 results) No results for input(s): PROBNP in the last 8760 hours.  CBG: No results for input(s): GLUCAP in the last 168 hours.  Recent Results (from the past 240 hour(s))  Stool culture     Status: None (Preliminary result)   Collection Time: 11/22/14  5:05 AM  Result Value Ref Range Status   Specimen Description STOOL  Final   Special Requests NONE  Final   Culture   Final    NO SUSPICIOUS COLONIES, CONTINUING TO HOLD Performed at [facility]    Report Status PENDING  Incomplete  Clostridium Difficile by PCR (not at [facility])     Status: None   Collection Time: 11/23/14 12:30 AM  Result Value Ref Range Status   C difficile by pcr NEGATIVE NEGATIVE Final     Studies: No results found.  Scheduled Meds: . ciprofloxacin  250 mg Oral BID  . fluticasone  2 spray Each Nare Daily  . loratadine  10 mg Oral  Daily  . metroNIDAZOLE  500 mg Oral 3 times per day  . sertraline  50 mg Oral Daily   Continuous Infusions:    Principal Problem:   Acute colitis Active Problems:   Pure hypercholesterolemia   Allergic rhinitis   Depression   Bilateral lower abdominal pain    Time spent: 25 minutes.     [facility],Shadana Pry  [facility] Pager [phone]  If 7PM-7AM, please contact night-coverage at [email or web address], password [email or web address] 11/25/2014, 7:17 PM  LOS: 3 days

## 2014-11-25 NOTE — Progress Notes (Signed)
Utilization review completed.  

## 2014-11-26 LAB — STOOL CULTURE

## 2014-11-26 LAB — GI PATHOGEN PANEL BY PCR, STOOL
C difficile toxin A/B: NOT DETECTED
Campylobacter by PCR: NOT DETECTED
Cryptosporidium by PCR: NOT DETECTED
E COLI (ETEC) LT/ST: NOT DETECTED
E coli 0157 by PCR: NOT DETECTED
G LAMBLIA BY PCR: NOT DETECTED
Norovirus GI/GII: NOT DETECTED
Rotavirus A by PCR: NOT DETECTED
SALMONELLA BY PCR: NOT DETECTED
SHIGELLA BY PCR: NOT DETECTED

## 2014-11-26 MED ORDER — SODIUM CHLORIDE 0.9 % IV BOLUS (SEPSIS)
1000.0000 mL | Freq: Once | INTRAVENOUS | Status: AC
  Administered 2014-11-26: 1000 mL via INTRAVENOUS

## 2014-11-26 MED ORDER — OXYCODONE-ACETAMINOPHEN 5-325 MG PO TABS
1.0000 | ORAL_TABLET | ORAL | Status: DC | PRN
  Administered 2014-11-26 (×3): 2 via ORAL
  Administered 2014-11-26: 1 via ORAL
  Filled 2014-11-26 (×2): qty 2
  Filled 2014-11-26: qty 1
  Filled 2014-11-26: qty 2

## 2014-11-26 MED ORDER — METRONIDAZOLE 500 MG PO TABS: 500.0000 mg | ORAL_TABLET | Freq: Three times a day (TID) | ORAL | Status: DC

## 2014-11-26 MED ORDER — OXYCODONE-ACETAMINOPHEN 5-325 MG PO TABS: 1.0000 | ORAL_TABLET | ORAL | Status: DC | PRN

## 2014-11-26 MED ORDER — CIPROFLOXACIN HCL 250 MG PO TABS: 250.0000 mg | ORAL_TABLET | Freq: Two times a day (BID) | ORAL | Status: DC

## 2014-11-26 NOTE — Discharge Summary (Signed)
Physician Discharge Summary  Brianna Mckinney ZOX:[id number] DOB: 1980-08-06 DOA: 11/21/2014  PCP: Eulis Foster, FNP  Admit date: 11/21/2014 Discharge date: 11/26/2014  Time spent: 30 minutes  Recommendations for Outpatient Follow-up:  1. Follow up with  gi AS NEEDED.  2. Follow up with PCP in one week.   Discharge Diagnoses:  Principal Problem:   Acute colitis Active Problems:   Pure hypercholesterolemia   Allergic rhinitis   Depression   Bilateral lower abdominal pain   Discharge Condition: improved.   Diet recommendation: soft diet.   Filed Weights   11/22/14 0323  Weight: 59.149 kg (130 lb 6.4 oz)    History of present illness: 34 year old lady admitted for nausea, vomiting and diarrhea.   Hospital Course:  1. Pan colitis: - admitted for IV antibiotics/ IV fluids and IV anti emetics. c diff pcr is negative. GI pathogen is negative.  CT abdomen showed pancolitis. Improving, started on clears . Advanced as tolerated. She is on soft diet and able to tolerate it.  She will be discharged home on oral antibiotics and pain meds.   2. Leukocytosis: probably from pan colitis. Improved.   3. Hypokalemia: Repleted as needed   Procedures:  none  Consultations:  GI   Discharge Exam: Filed Vitals:   11/26/14 1950  BP: 102/54  Pulse:   Temp:   Resp:     General: alert afebrile comfortable Cardiovascular: s1s2 Respiratory:ctab  Discharge Instructions   Discharge Instructions    Discharge instructions    Complete by:  As directed   Follow up with PCP in one week.  Follow up with GI as needed.          Current Discharge Medication List    START taking these medications   Details  ciprofloxacin (CIPRO) 250 MG tablet Take 1 tablet (250 mg total) by mouth 2 (two) times daily. Qty: 22 tablet, Refills: 0    metroNIDAZOLE (FLAGYL) 500 MG tablet Take 1 tablet (500 mg total) by mouth every 8 (eight) hours. Qty: 33 tablet, Refills: 0     oxyCODONE-acetaminophen (PERCOCET/ROXICET) 5-325 MG per tablet Take 1-2 tablets by mouth every 4 (four) hours as needed for moderate pain or severe pain. Qty: 15 tablet, Refills: 0      CONTINUE these medications which have NOT CHANGED   Details  Armodafinil (NUVIGIL) 150 MG tablet Take 75-150 mg by mouth daily as needed (for alertness).    atorvastatin (LIPITOR) 10 MG tablet Take 1 tablet (10 mg total) by mouth daily. Qty: 90 tablet, Refills: 1    etonogestrel-ethinyl estradiol (NUVARING) 0.12-0.015 MG/24HR vaginal ring USE AS DIRECTED Qty: 3 each, Refills: 6    fexofenadine (ALLEGRA) 180 MG tablet Take 180 mg by mouth daily.    folic acid (FOLVITE) 800 MCG tablet Take 400 mcg by mouth daily.    mometasone (NASONEX) 50 MCG/ACT nasal spray INSTILL 2 SPRAYS INTO THE NOSE DAILY. Qty: 17 g, Refills: 3    Multiple Vitamin (MULTIVITAMIN WITH MINERALS) TABS tablet Take 1 tablet by mouth daily. One-A-Day Women's Vitamin.    Naphazoline-Pheniramine (OPCON-A OP) Apply 1 drop to eye 3 (three) times daily as needed (for eye irritation).    sertraline (ZOLOFT) 50 MG tablet TAKE 1 TABLET BY MOUTH DAILY. Qty: 30 tablet, Refills: 6    triamcinolone cream (KENALOG) 0.1 % Apply 1 application topically 2 (two) times daily. Qty: 30 g, Refills: 0      STOP taking these medications     triamcinolone ointment (  KENALOG) 0.1 %        No Known Allergies Follow-up Information    Follow up with Eulis Foster, FNP. Schedule an appointment as soon as possible for a visit in 1 week.   Specialty:  Family Medicine   Contact information:   [address] [place] [address] [phone]        The results of significant diagnostics from this hospitalization (including imaging, microbiology, ancillary and laboratory) are listed below for reference.    Significant Diagnostic Studies: Ct Abdomen Pelvis W Contrast  11/22/2014   CLINICAL DATA:  Acute onset of generalized abdominal pain,  nausea, vomiting and diarrhea. Blood in stools. Leukocytosis. Microhematuria. Initial encounter.  EXAM: CT ABDOMEN AND PELVIS WITH CONTRAST  TECHNIQUE: Multidetector CT imaging of the abdomen and pelvis was performed using the standard protocol following bolus administration of intravenous contrast.  CONTRAST:  OMNIPAQUE IOHEXOL 300 MG/ML  SOLN  COMPARISON:  None.  FINDINGS: The visualized lung bases are clear.  The liver and spleen are unremarkable in appearance. The gallbladder is within normal limits. The pancreas and adrenal glands are unremarkable.  The kidneys are unremarkable in appearance. There is no evidence of hydronephrosis. No renal or ureteral stones are seen. No perinephric stranding is appreciated.  No free fluid is identified. The small bowel is unremarkable in appearance. The stomach is within normal limits. No acute vascular abnormalities are seen.  The appendix is normal in caliber, without evidence of appendicitis.  There is significant wall thickening along the ascending, transverse and descending colon, with surrounding soft tissue inflammation and trace fluid, compatible with infectious or inflammatory colitis. Underlying vasculature appears prominent, reflecting hyperemia, without evidence of ischemia.  The bladder is mildly distended and grossly unremarkable in appearance. The uterus is unremarkable in appearance. A vaginal ring is noted in expected position. The ovaries are grossly symmetric and unremarkable in appearance. No inguinal lymphadenopathy is seen.  No acute osseous abnormalities are identified.  IMPRESSION: Significant acute colitis involving the ascending, transverse and descending colon, with diffuse wall thickening, surrounding soft tissue inflammation and trace fluid. This is likely infectious or inflammatory in nature. Underlying hyperemia noted.   Electronically Signed   By: Roanna Raider M.D.   On: 11/22/2014 01:48    Microbiology: Recent Results (from the  past 240 hour(s))  Stool culture     Status: None   Collection Time: 11/22/14  5:05 AM  Result Value Ref Range Status   Specimen Description STOOL  Final   Special Requests NONE  Final   Culture   Final    NO SALMONELLA, SHIGELLA, CAMPYLOBACTER, YERSINIA, OR E.COLI 0157:H7 ISOLATED Performed at [facility]    Report Status 11/26/2014 FINAL  Final  Clostridium Difficile by PCR (not at [facility])     Status: None   Collection Time: 11/23/14 12:30 AM  Result Value Ref Range Status   C difficile by pcr NEGATIVE NEGATIVE Final     Labs: Basic Metabolic Panel:  Recent Labs Lab 11/21/14 2304 11/22/14 0420 11/24/14 1314 11/25/14 0355  NA 135 134* 132* 134*  K 3.4* 3.6 2.9* 3.5  CL 101 104 99* 103  CO2 21* 21* 27 27  GLUCOSE 147* 164* 100* 100*  BUN 10 7 <5* <5*  CREATININE 0.73 0.58 0.58 0.53  CALCIUM 9.0 7.7* 7.8* 7.5*  MG  --   --  1.8  --    Liver Function Tests:  Recent Labs Lab 11/21/14 2304 11/22/14 0420  AST  21 18  ALT 16 13*  ALKPHOS 50 45  BILITOT 0.8 0.6  PROT 7.5 6.6  ALBUMIN 3.8 3.2*   No results for input(s): LIPASE, AMYLASE in the last 168 hours. No results for input(s): AMMONIA in the last 168 hours. CBC:  Recent Labs Lab 11/21/14 2304 11/22/14 0420  WBC 12.3* 12.3*  NEUTROABS 10.8*  --   HGB 13.9 13.3  HCT 39.6 38.4  MCV 94.5 96.0  PLT 234 205   Cardiac Enzymes: No results for input(s): CKTOTAL, CKMB, CKMBINDEX, TROPONINI in the last 168 hours. BNP: BNP (last 3 results) No results for input(s): BNP in the last 8760 hours.  ProBNP (last 3 results) No results for input(s): PROBNP in the last 8760 hours.  CBG: No results for input(s): GLUCAP in the last 168 hours.     SignedKathlen Mody:  Geneviene Tesch  [facility] 11/26/2014, 7:57 PM

## 2014-11-29 ENCOUNTER — Telehealth: Payer: Self-pay | Admitting: Internal Medicine

## 2014-11-29 NOTE — Telephone Encounter (Signed)
Left message for pt to call back  °

## 2014-12-03 NOTE — Telephone Encounter (Signed)
Pt states she is still on Cipro and Flagyl, should finish up on Friday. Pt wanted to make sure this covers the ecoli that was present on stool pathogen panel and if she needs any further antibiotics. Please advise.

## 2014-12-03 NOTE — Telephone Encounter (Signed)
She is covered.

## 2014-12-04 NOTE — Telephone Encounter (Signed)
Spoke with pt and she is aware.

## 2015-01-06 ENCOUNTER — Other Ambulatory Visit: Payer: Self-pay | Admitting: *Deleted

## 2015-01-06 MED ORDER — ATORVASTATIN CALCIUM 10 MG PO TABS: 10.0000 mg | ORAL_TABLET | Freq: Every day | ORAL | Status: DC

## 2015-02-07 ENCOUNTER — Other Ambulatory Visit: Payer: Self-pay | Admitting: Family

## 2015-05-29 MED FILL — LORazepam 1 MG TABS: 1 | 30 days supply | Qty: 30 | Fill #0

## 2015-06-16 MED FILL — NUVARING VAGINAL RING: 0.12-0.015 | 28 days supply | Qty: 1 | Fill #3

## 2015-06-16 MED FILL — SERTRALINE HCL 50 MG TABLET: 50 | 30 days supply | Qty: 30 | Fill #3

## 2015-06-16 MED FILL — ARMODAFINIL 150 MG TABLET: 150 | 30 days supply | Qty: 30 | Fill #3

## 2015-06-16 MED FILL — PAZEO 0.7% EYE DROPS: 0.7 | 30 days supply | Qty: 3 | Fill #1

## 2015-06-16 MED FILL — MOMETASONE FUROATE 50 MCG S: 50 | 30 days supply | Qty: 17 | Fill #3

## 2015-07-21 MED FILL — NUVARING VAGINAL RING: 0.12-0.015 | 28 days supply | Qty: 1 | Fill #4

## 2015-07-21 MED FILL — SERTRALINE HCL 50 MG TABLET: 50 | 30 days supply | Qty: 30 | Fill #0

## 2015-08-11 ENCOUNTER — Ambulatory Visit: Payer: Self-pay | Admitting: Family Medicine

## 2015-08-14 ENCOUNTER — Ambulatory Visit (INDEPENDENT_AMBULATORY_CARE_PROVIDER_SITE_OTHER): Payer: Self-pay | Admitting: Family Medicine

## 2015-08-14 ENCOUNTER — Encounter: Payer: Self-pay | Admitting: Family Medicine

## 2015-08-14 VITALS — BP 102/60 | HR 78 | Temp 98.3°F | Wt 130.1 lb

## 2015-08-14 DIAGNOSIS — J309 Allergic rhinitis, unspecified: Secondary | ICD-10-CM

## 2015-08-14 DIAGNOSIS — Z309 Encounter for contraceptive management, unspecified: Secondary | ICD-10-CM

## 2015-08-14 DIAGNOSIS — E7800 Pure hypercholesterolemia, unspecified: Secondary | ICD-10-CM

## 2015-08-14 LAB — COMPREHENSIVE METABOLIC PANEL
ALT: 16 U/L (ref 0–35)
AST: 17 U/L (ref 0–37)
Albumin: 4.2 g/dL (ref 3.5–5.2)
Alkaline Phosphatase: 53 U/L (ref 39–117)
BUN: 10 mg/dL (ref 6–23)
CHLORIDE: 105 meq/L (ref 96–112)
CO2: 28 mEq/L (ref 19–32)
CREATININE: 0.61 mg/dL (ref 0.40–1.20)
Calcium: 10 mg/dL (ref 8.4–10.5)
GFR: 117.55 mL/min (ref >60.00)
Glucose, Bld: 87 mg/dL (ref 70–99)
Potassium: 4.7 mEq/L (ref 3.5–5.1)
SODIUM: 140 meq/L (ref 135–145)
TOTAL PROTEIN: 7 g/dL (ref 6.0–8.3)
Total Bilirubin: 0.6 mg/dL (ref 0.2–1.2)

## 2015-08-14 LAB — CBC WITH DIFFERENTIAL/PLATELET
Basophils Absolute: 0 10*3/uL (ref 0.0–0.1)
Basophils Relative: 0.4 % (ref 0.0–3.0)
Eosinophils Absolute: 0.1 10*3/uL (ref 0.0–0.7)
Eosinophils Relative: 2.6 % (ref 0.0–5.0)
HCT: 41.1 % (ref 36.0–46.0)
HEMOGLOBIN: 14.1 g/dL (ref 12.0–15.0)
LYMPHS PCT: 36.7 % (ref 12.0–46.0)
Lymphs Abs: 1.5 10*3/uL (ref 0.7–4.0)
MCHC: 34.3 g/dL (ref 30.0–36.0)
MCV: 93.6 fl (ref 78.0–100.0)
MONO ABS: 0.4 10*3/uL (ref 0.1–1.0)
Monocytes Relative: 9.7 % (ref 3.0–12.0)
Neutro Abs: 2 10*3/uL (ref 1.4–7.7)
Neutrophils Relative %: 50.6 % (ref 43.0–77.0)
Platelets: 258 10*3/uL (ref 150.0–400.0)
RBC: 4.4 Mil/uL (ref 3.87–5.11)
RDW: 12.9 % (ref 11.5–15.5)
WBC: 4 10*3/uL (ref 4.0–10.5)

## 2015-08-14 LAB — LIPID PANEL
Cholesterol: 196 mg/dL (ref 0–200)
HDL: 77.9 mg/dL (ref >39.00)
LDL CALC: 100 mg/dL — AB (ref 0–99)
NonHDL: 118.56
Total CHOL/HDL Ratio: 3
Triglycerides: 95 mg/dL (ref 0.0–149.0)
VLDL: 19 mg/dL (ref 0.0–40.0)

## 2015-08-14 MED ORDER — ETONOGESTREL-ETHINYL ESTRADIOL 0.12-0.015 MG/24HR VA RING: 1.0000 | VAGINAL_RING | VAGINAL | Status: DC

## 2015-08-14 MED ORDER — MOMETASONE FUROATE 50 MCG/ACT NA SUSP: NASAL | Status: DC

## 2015-08-14 MED ORDER — ATORVASTATIN CALCIUM 10 MG PO TABS: 10.0000 mg | ORAL_TABLET | Freq: Every day | ORAL | Status: DC

## 2015-08-14 NOTE — Progress Notes (Signed)
Pre visit review using our clinic review tool, if applicable. No additional management support is needed unless otherwise documented below in the visit note. 

## 2015-08-14 NOTE — Progress Notes (Signed)
Subjective:    Patient ID: Brianna Mckinney, female    DOB: 03-Apr-1981, 35 y.o.   MRN: [id number]  HPI  Brianna Mckinney is a 35 year old female who presents today requesting refills for her medications and to address current regimen. I am seeing her today as her PCP is unavailable at this time.  Hypercholesterolemia: History of LDL elevation noted with last result on 06/12/2014 noted as 110. Total cholesterol noted on that date of 219. She denies any myalgias, chest pain, palpitations or SOB today. 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 Diet is modified heart healthy diet however she does state that she has fried food once/week and she would like to decrease this currently.   Allergic rhinitis: Nasonex has been as used as OTC fluticasone did not provider benefit for her. She denies symptoms of rhinitis, water eyes, or nosebleeds and states that nasonex daily has provided excellent benefit for her.  Depression: Patient denies depressed or anxious mood. She is followed by psychiatry and is currently maintained on sertraline  daily without adverse effects noted.  Contraceptive use of Nuvaring. She is a nonsmoker and denies any bleeding, weight changes, mood changes, N/V, or breast tenderness.  Review of Systems  Constitutional: Negative for fever, chills and fatigue.  HENT: Negative for congestion, nosebleeds, postnasal drip, rhinorrhea, sinus pressure, sneezing and sore throat.   Eyes: Negative for itching and visual disturbance.  Respiratory: Negative for cough, chest tightness, shortness of breath and wheezing.   Cardiovascular: Negative for chest pain, palpitations and leg swelling.  Gastrointestinal: Negative for nausea, vomiting, abdominal pain, diarrhea, constipation and blood in stool.  Endocrine: Negative for polydipsia, polyphagia and polyuria.  Genitourinary: Negative for dysuria, urgency, frequency, flank pain, vaginal bleeding, vaginal discharge, vaginal pain and  pelvic pain.  Musculoskeletal: Negative for myalgias, back pain and arthralgias.  Skin: Negative for rash.  Neurological: Negative for dizziness, light-headedness, numbness and headaches.  Psychiatric/Behavioral:       Denies depressed or anxious mood   Past Medical History  Diagnosis Date  . Depression   . Allergy   . Hyperlipidemia   . UTI (lower urinary tract infection)     Social History   Social History  . Marital Status: Single    Spouse Name: N/A  . Number of Children: N/A  . Years of Education: N/A   Occupational History  . Not on file.   Social History Main Topics  . Smoking status: Never Smoker   . Smokeless tobacco: Not on file  . Alcohol Use: Yes  . Drug Use: No  . Sexual Activity: Yes    Birth Control/ Protection: Other-see comments   Other Topics Concern  . Not on file   Social History Narrative    Past Surgical History  Procedure Laterality Date  . Wisdom tooth extraction    . Refractive surgery    . Eye surgery      right eye    Family History  Problem Relation Age of Onset  . Sudden death Mother     suicide  . Depression Mother   . Arthritis Father     RA    No Known Allergies  Current Outpatient Prescriptions on File Prior to Visit  Medication Sig Dispense Refill  . Armodafinil (NUVIGIL) 150 MG tablet Take 75-150 mg by mouth daily as needed (for alertness).    . fexofenadine (ALLEGRA) 180 MG tablet Take 180 mg by mouth daily.    . folic acid (  FOLVITE) 800 MCG tablet Take 400 mcg by mouth daily.    . Multiple Vitamin (MULTIVITAMIN WITH MINERALS) TABS tablet Take 1 tablet by mouth daily. One-A-Day Women's Vitamin.    . Naphazoline-Pheniramine (OPCON-A OP) Apply 1 drop to eye 3 (three) times daily as needed (for eye irritation).    Marland Kitchen. oxyCODONE-acetaminophen (PERCOCET/ROXICET) 5-325 MG per tablet Take 1-2 tablets by mouth every 4 (four) hours as needed for moderate pain or severe pain. 15 tablet 0  . sertraline (ZOLOFT) 50 MG tablet  TAKE 1 TABLET BY MOUTH DAILY. 30 tablet 6  . triamcinolone cream (KENALOG) 0.1 % Apply 1 application topically 2 (two) times daily. (Patient taking differently: Apply 1 application topically 2 (two) times daily as needed (for ezcema.). ) 30 g 0   No current facility-administered medications on file prior to visit.    BP 102/60 mmHg  Pulse 78  Temp(Src) 98.3 F (36.8 C) (Oral)  Wt 130 lb 1.6 oz (59.013 kg)        Objective:   Physical Exam  Constitutional: She is oriented to person, place, and time. She appears well-developed and well-nourished.  Eyes: Pupils are equal, round, and reactive to light.  Neck: Normal range of motion. Neck supple.  Cardiovascular: Normal rate and regular rhythm.   Pulmonary/Chest: Effort normal and breath sounds normal. She has no wheezes. She has no rales.  Abdominal: Soft. Bowel sounds are normal. There is no tenderness.  Musculoskeletal: She exhibits no edema.  Lymphadenopathy:    She has no cervical adenopathy.  Neurological: She is alert and oriented to person, place, and time. Coordination normal.  Skin: Skin is warm and dry. No rash noted.  Psychiatric: She has a normal mood and affect. Her behavior is normal.      Assessment & Plan:  1. Pure hypercholesterolemia  - atorvastatin (LIPITOR) 10 MG tablet; Take 1 tablet (10 mg total) by mouth daily.  Dispense: 90 tablet; Refill: 0 - CBC with Differential/Platelet - Comprehensive metabolic panel - Lipid panel  2. Allergic rhinitis, unspecified allergic rhinitis type  - mometasone (NASONEX) 50 MCG/ACT nasal spray; INSTILL 2 SPRAYS INTO THE NOSE DAILY.  Dispense: 17 g; Refill: 3  3. Encounter for contraceptive management, unspecified encounter Discussed with patient that evaluation of future birth control methods can be discussed at her routine wellness visit.  - etonogestrel-ethinyl estradiol (NUVARING) 0.12-0.015 MG/24HR vaginal ring; Place 1 each vaginally See admin instructions. Insert  vaginally and leave in place for 3 consecutive weeks, then remove for 1 week.  Dispense: 3 each; Refill: PRN  Advised patient to establish care for routine wellness and medication management. Also discussed risks/benefits of medications and the importance of following up for primary care. Lab results will be reviewed and patient will be advised with recommendations. Refills provided today but future refills will require establishing care and routine wellness management. Patient voiced understanding and agreed with plan.

## 2015-08-14 NOTE — Patient Instructions (Signed)
Please go to lab before you leave blood work and results will be called to you within one week or sooner if needed. Also, establish care with a PCP for routine wellness and medication management.  It was a pleasure seeing you today!

## 2015-08-19 MED FILL — NUVARING VAGINAL RING: 0.12-0.015 | 28 days supply | Qty: 1 | Fill #5

## 2015-08-19 MED FILL — ATORVASTATIN 10 MG TABLET: 10 | 30 days supply | Qty: 30 | Fill #0

## 2015-09-05 MED FILL — ARMODAFINIL 150 MG TABLET: 150 | 30 days supply | Qty: 30 | Fill #0

## 2015-09-05 MED FILL — SERTRALINE HCL 50 MG TABLET: 50 | 30 days supply | Qty: 30 | Fill #1

## 2015-09-16 MED FILL — NUVARING VAGINAL RING: 0.12-0.015 | 28 days supply | Qty: 1 | Fill #6

## 2015-09-29 ENCOUNTER — Ambulatory Visit: Payer: Self-pay | Admitting: Family Medicine

## 2015-10-09 MED FILL — ARMODAFINIL 150 MG TABLET: 150 | 30 days supply | Qty: 30 | Fill #1

## 2015-10-09 MED FILL — SERTRALINE HCL 50 MG TABLET: 50 | 30 days supply | Qty: 30 | Fill #2

## 2015-10-09 MED FILL — MOMETASONE FUROATE 50 MCG S: 50 | 30 days supply | Qty: 17 | Fill #0

## 2015-10-09 MED FILL — NUVARING VAGINAL RING: 0.12-0.015 | 28 days supply | Qty: 1 | Fill #7

## 2015-10-09 MED FILL — ATORVASTATIN 10 MG TABLET: 10 | 30 days supply | Qty: 30 | Fill #1

## 2015-11-27 MED FILL — ARMODAFINIL 150 MG TABLET: 150 | 30 days supply | Qty: 30 | Fill #2

## 2015-11-27 MED FILL — SERTRALINE HCL 50 MG TABLET: 50 | 30 days supply | Qty: 30 | Fill #3

## 2015-11-27 MED FILL — NUVARING VAGINAL RING: 0.12-0.015 | 28 days supply | Qty: 1 | Fill #8

## 2015-11-27 MED FILL — ATORVASTATIN 10 MG TABLET: 10 | 30 days supply | Qty: 30 | Fill #2

## 2015-11-27 MED FILL — MOMETASONE FUROATE 50 MCG S: 50 | 30 days supply | Qty: 17 | Fill #1

## 2015-12-19 ENCOUNTER — Ambulatory Visit (INDEPENDENT_AMBULATORY_CARE_PROVIDER_SITE_OTHER): Payer: Managed Care, Other (non HMO) | Admitting: Family Medicine

## 2015-12-19 ENCOUNTER — Encounter: Payer: Self-pay | Admitting: Family Medicine

## 2015-12-19 VITALS — BP 88/50 | HR 86 | Temp 98.4°F | Resp 12 | Ht 65.0 in | Wt 131.2 lb

## 2015-12-19 DIAGNOSIS — J309 Allergic rhinitis, unspecified: Secondary | ICD-10-CM | POA: Diagnosis not present

## 2015-12-19 DIAGNOSIS — L309 Dermatitis, unspecified: Secondary | ICD-10-CM

## 2015-12-19 DIAGNOSIS — G4710 Hypersomnia, unspecified: Secondary | ICD-10-CM | POA: Insufficient documentation

## 2015-12-19 DIAGNOSIS — E7800 Pure hypercholesterolemia, unspecified: Secondary | ICD-10-CM

## 2015-12-19 DIAGNOSIS — J069 Acute upper respiratory infection, unspecified: Secondary | ICD-10-CM | POA: Diagnosis not present

## 2015-12-19 MED ORDER — MOMETASONE FUROATE 50 MCG/ACT NA SUSP: NASAL | 6 refills | Status: DC

## 2015-12-19 MED ORDER — TRIAMCINOLONE ACETONIDE 0.025 % EX OINT: 1.0000 "application " | TOPICAL_OINTMENT | Freq: Every day | CUTANEOUS | 1 refills | Status: DC | PRN

## 2015-12-19 NOTE — Patient Instructions (Addendum)
A few things to remember from today's visit:   URI, acute  Allergic rhinitis, unspecified allergic rhinitis type - Plan: mometasone (NASONEX) 50 MCG/ACT nasal spray  Pure hypercholesterolemia  Eczema - Plan: triamcinolone (KENALOG) 0.025 % ointment  Stop Lipitor and try low fat diet and regular exercise, we will recheck cholesterol in 4 months.   You have a cold, viral and self-limited. Over the counter medications as decongestants and cold medications usually help, they need to be taken with caution if there is a history of high blood pressure or palpitations. Tylenol and/or Ibuprofen also helps with most symptoms (headache, muscle aching, fever,etc) Plenty of fluids. Honey helps with cough. Steam inhalations helps with runny nose, nasal congestion, and may prevent sinus infections. Cough and nasal congestion could last a few days and sometimes weeks. Please follow in not any better in 1-2 weeks or if symptoms get worse.  Please be sure medication list is accurate. If a new problem present, please set up appointment sooner than planned today.

## 2015-12-19 NOTE — Progress Notes (Signed)
HPI:   [name] is a 35 y.o. female, who is here today to establish care with me.  Former PCP: [name]. Last preventive routine visit: 05/2014.  Concerns today: Cold.  3 days of non productive cough.  No fever, chill, or myalgias. + Nasal congestion, rhinorrhea, sore throat, and post nasal drainage. + Dysphonia, no stridor.  Denies chest pain, dyspnea, or wheezing.   No Hx of recent travel. No sick contact. No known insect bite. + Hx of allergies.  OTC medications for this problem: none. Symptoms otherwise stable.    History of depression and anxiety, she follows with psychiatrist, sees Mr. Brianna Silvan, PA every 4 months.  Hx of allergic rhinitis, allergic conjunctivitis, and eczema. She is requesting refill for triamcinolone ointment she uses as needed on external ear. She also uses triamcinolone cream on rest of her body as needed. She follows with optometrist, who prescribes her Pazeo eyedrops. Nasonex nasal spray and Allegra 180 mg daily for allergic rhintis.     Hyperlipidemia:  She is on Lipitor 10 mg. No smoker, no Hx of HTN. Following a low fat diet. She has not noted side effects with medication.  Lab Results  Component Value Date   CHOL 196 08/14/2015   HDL 77.90 08/14/2015   LDLCALC 100 (H) 08/14/2015   LDLDIRECT 135.3 03/30/2013   TRIG 95.0 08/14/2015   CHOLHDL 3 08/14/2015    She is eating healthier, still not exercising regularly.  She is a travel Engineer, civil (consulting).   Review of Systems  Constitutional: Positive for fatigue. Negative for activity change, appetite change and fever.  HENT: Positive for congestion, postnasal drip, rhinorrhea, sneezing, sore throat and voice change. Negative for ear pain, mouth sores, sinus pressure and trouble swallowing.   Eyes: Negative for discharge, redness and itching.  Respiratory: Positive for cough. Negative for shortness of breath and wheezing.   Cardiovascular: Negative.   Gastrointestinal:  Negative for abdominal pain, diarrhea, nausea and vomiting.       No changes in bowel habits.  Musculoskeletal: Negative for myalgias and neck pain.  Skin: Negative for pallor and rash.  Allergic/Immunologic: Negative for environmental allergies.  Neurological: Negative for weakness, numbness and headaches.  Hematological: Negative for adenopathy.  Psychiatric/Behavioral: Negative for confusion. The patient is nervous/anxious.       Current Outpatient Prescriptions on File Prior to Visit  Medication Sig Dispense Refill  . Armodafinil (NUVIGIL) 150 MG tablet Take 75-150 mg by mouth daily as needed (for alertness).    Marland Kitchen etonogestrel-ethinyl estradiol (NUVARING) 0.12-0.015 MG/24HR vaginal ring Place 1 each vaginally See admin instructions. Insert vaginally and leave in place for 3 consecutive weeks, then remove for 1 week. 3 each PRN  . fexofenadine (ALLEGRA) 180 MG tablet Take 180 mg by mouth daily.    . folic acid (FOLVITE) 800 MCG tablet Take 400 mcg by mouth daily.    . Multiple Vitamin (MULTIVITAMIN WITH MINERALS) TABS tablet Take 1 tablet by mouth daily. One-A-Day Women's Vitamin.    Marland Kitchen sertraline (ZOLOFT) 50 MG tablet TAKE 1 TABLET BY MOUTH DAILY. 30 tablet 6  . triamcinolone cream (KENALOG) 0.1 % Apply 1 application topically 2 (two) times daily. (Patient taking differently: Apply 1 application topically 2 (two) times daily as needed (for ezcema.). ) 30 g 0   No current facility-administered medications on file prior to visit.      Past Medical History:  Diagnosis Date  . Allergy   . Depression   . Hyperlipidemia   .  UTI (lower urinary tract infection)    No Known Allergies  Family History  Problem Relation Age of Onset  . Sudden death Mother     suicide  . Depression Mother   . Arthritis Father     RA    Social History   Social History  . Marital status: Single    Spouse name: N/A  . Number of children: N/A  . Years of education: N/A   Social History Main  Topics  . Smoking status: Never Smoker  . Smokeless tobacco: None  . Alcohol use Yes  . Drug use: No  . Sexual activity: Yes    Birth control/ protection: Other-see comments   Other Topics Concern  . None   Social History Narrative  . None    Vitals:   12/19/15 1419  BP: (!) 88/50  Pulse: 86  Resp: 12  Temp: 98.4 F (36.9 C)    Body mass index is 21.83 kg/m.  O2 sat RA 98%    Physical Exam  Constitutional: She is oriented to person, place, and time. She appears well-developed and well-nourished. No distress.  HENT:  Head: Atraumatic.  Right Ear: Hearing, tympanic membrane, external ear and ear canal normal.  Left Ear: Hearing, tympanic membrane, external ear and ear canal normal.  Nose: Rhinorrhea present. No mucosal edema. Right sinus exhibits no maxillary sinus tenderness and no frontal sinus tenderness. Left sinus exhibits no maxillary sinus tenderness and no frontal sinus tenderness.  Mouth/Throat: Uvula is midline and mucous membranes are normal. Posterior oropharyngeal erythema present. No oropharyngeal exudate or posterior oropharyngeal edema.  Mild dysphonia. Postnasal drainage.  Eyes: Conjunctivae are normal.  Neck: No muscular tenderness present. No edema and no erythema present.  Cardiovascular: Normal rate and regular rhythm.   Respiratory: Effort normal and breath sounds normal. No respiratory distress.  Lymphadenopathy:       Head (right side): No submandibular adenopathy present.       Head (left side): No submandibular adenopathy present.    She has no cervical adenopathy.  Neurological: She is alert and oriented to person, place, and time.  Skin: Skin is warm. No rash noted. No erythema.  Psychiatric: Her speech is normal. Her affect is labile.      ASSESSMENT AND PLAN:     Brianna Mckinney was seen today for new patient (initial visit).  Diagnoses and all orders for this visit:  URI, acute  Symptoms suggests a viral etiology, I explained  patient that symptomatic treatment is usually recommended in this case, so I do not think abx is needed at this time. Voice rest. Instructed to monitor for signs of complications, including new onset of fever among some, clearly instructed about warning signs. I also explained that cough and nasal congestion can last a few days and sometimes weeks. F/U as needed.   Allergic rhinitis, unspecified allergic rhinitis type  Stable. No changes in current management. Follow-up in 2 months, before if needed.   -     mometasone (NASONEX) 50 MCG/ACT nasal spray; INSTILL 2 SPRAYS INTO THE NOSE DAILY.  Pure hypercholesterolemia  Mild, HDL> 60, no smoker, no Hx of HTN, < 40, and female; so I do not think pharmacologic treatment is needed. She agrees with trying non pharmacologic treatment and re-check in 4 months.   Eczema  Stable. No changes in current management. Some side effects of topical steroid reviewed. F/U in 12 months.  -     triamcinolone (KENALOG) 0.025 % ointment; Apply 1 application  topically daily as needed. External ear.  Hypersomnia, unspecified  She will continue following with psychiatrist for hypersomnia, depression, and anxiety.         -Next OV planning on CPE + HLD follow-up.       Betty G. [place], MD  [facility]. [place] office.

## 2015-12-19 NOTE — Progress Notes (Signed)
Pre visit review using our clinic review tool, if applicable. No additional management support is needed unless otherwise documented below in the visit note. 

## 2016-01-13 MED FILL — NUVARING VAGINAL RING: 0.12-0.015 | 28 days supply | Qty: 1 | Fill #9

## 2016-01-13 MED FILL — SERTRALINE HCL 50 MG TABLET: 50 | 30 days supply | Qty: 30 | Fill #0

## 2016-02-17 MED FILL — SERTRALINE HCL 50 MG TABLET: 50 | 30 days supply | Qty: 30 | Fill #0

## 2016-02-17 MED FILL — NUVARING VAGINAL RING: 0.12-0.015 | 28 days supply | Qty: 1 | Fill #0 | Status: TO

## 2016-02-17 MED FILL — LORazepam 1 MG TABS: 1 | 30 days supply | Qty: 30 | Fill #0

## 2016-02-20 ENCOUNTER — Emergency Department (HOSPITAL_COMMUNITY)
Admission: EM | Admit: 2016-02-20 | Discharge: 2016-02-20 | Disposition: A | Discharge status: Home / Self Care | Payer: Managed Care, Other (non HMO) | Attending: Emergency Medicine | Admitting: Emergency Medicine

## 2016-02-20 ENCOUNTER — Encounter (HOSPITAL_COMMUNITY): Payer: Self-pay

## 2016-02-20 ENCOUNTER — Emergency Department (HOSPITAL_BASED_OUTPATIENT_CLINIC_OR_DEPARTMENT_OTHER)
Admit: 2016-02-20 | Discharge: 2016-02-20 | Disposition: A | Discharge status: Home / Self Care | Payer: Managed Care, Other (non HMO) | Attending: Emergency Medicine | Admitting: Emergency Medicine

## 2016-02-20 ENCOUNTER — Emergency Department (HOSPITAL_COMMUNITY): Payer: Managed Care, Other (non HMO)

## 2016-02-20 DIAGNOSIS — R0602 Shortness of breath: Secondary | ICD-10-CM | POA: Insufficient documentation

## 2016-02-20 DIAGNOSIS — M79605 Pain in left leg: Secondary | ICD-10-CM | POA: Diagnosis not present

## 2016-02-20 DIAGNOSIS — M79609 Pain in unspecified limb: Secondary | ICD-10-CM | POA: Diagnosis not present

## 2016-02-20 DIAGNOSIS — Z79899 Other long term (current) drug therapy: Secondary | ICD-10-CM | POA: Insufficient documentation

## 2016-02-20 LAB — CBC
HEMATOCRIT: 37.1 % (ref 36.0–46.0)
HEMOGLOBIN: 12.8 g/dL (ref 12.0–15.0)
MCH: 31.7 pg (ref 26.0–34.0)
MCHC: 34.5 g/dL (ref 30.0–36.0)
MCV: 91.8 fL (ref 78.0–100.0)
PLATELETS: 249 10*3/uL (ref 150–400)
RBC: 4.04 MIL/uL (ref 3.87–5.11)
RDW: 12.3 % (ref 11.5–15.5)
WBC: 6.5 10*3/uL (ref 4.0–10.5)

## 2016-02-20 LAB — I-STAT TROPONIN, ED: Troponin i, poc: 0.01 ng/mL (ref 0.00–0.08)

## 2016-02-20 LAB — URINALYSIS, ROUTINE W REFLEX MICROSCOPIC
BILIRUBIN URINE: NEGATIVE
Glucose, UA: NEGATIVE mg/dL
Hgb urine dipstick: NEGATIVE
Ketones, ur: NEGATIVE mg/dL
Leukocytes, UA: NEGATIVE
NITRITE: NEGATIVE
Protein, ur: NEGATIVE mg/dL
SPECIFIC GRAVITY, URINE: 1.007 (ref 1.005–1.030)
pH: 7 (ref 5.0–8.0)

## 2016-02-20 LAB — BASIC METABOLIC PANEL
Anion gap: 7 (ref 5–15)
BUN: 9 mg/dL (ref 6–20)
CHLORIDE: 105 mmol/L (ref 101–111)
CO2: 26 mmol/L (ref 22–32)
Calcium: 9.3 mg/dL (ref 8.9–10.3)
Creatinine, Ser: 0.46 mg/dL (ref 0.44–1.00)
GFR calc non Af Amer: >60 mL/min (ref >60)
Glucose, Bld: 88 mg/dL (ref 65–99)
POTASSIUM: 3.7 mmol/L (ref 3.5–5.1)
SODIUM: 138 mmol/L (ref 135–145)

## 2016-02-20 LAB — HCG, QUANTITATIVE, PREGNANCY

## 2016-02-20 NOTE — ED Triage Notes (Addendum)
PT C/O PAIN AND WARMTH BEHIND LEFT THIGH RADIATING UP TO THE LEFT GROIN  AND DOWN BEHIND THE LEFT KNEE SINCE Monday AFTERNOON. THE IS TAKING BIRTH CONTROL MEDS AND [place], PER PT. DENIES CHEST PAIN OR INJURY, BUT ADMITS TO SLIGHT SOB.

## 2016-02-20 NOTE — ED Provider Notes (Signed)
[facility]-EMERGENCY DEPT Provider Note   CSN: [id number] Arrival date & time: 02/20/16  1331     History   Chief Complaint Chief Complaint  Patient presents with  . Leg Pain    LEFT    HPI MARONDA CAISON is a 35 y.o. female.  The history is provided by the patient and medical records. No language interpreter was used.  Leg Pain     TONJI ELLIFF is a 35 y.o. female  with a PMH of HLD who presents to the Emergency Department complaining of worsening left leg pain x 5 days. Patient states pain initially started behind her left knee and over the last several days has been radiating up to groin. She states that her symptoms began after driving to and from [facility] for the weekend. No medications taken prior to arrival for symptoms. She uses nuva-ring for birth control. No known injury or trauma. No cough, chest pain or leg swelling. No history of DVT or clotting disorder. She does endorse intermittent shortness of breath which she has been attributing to anxiety.   Past Medical History:  Diagnosis Date  . Depression   . Hyperlipidemia     There are no active problems to display for this patient.   Past Surgical History:  Procedure Laterality Date  . EYE SURGERY     LEFT LASER    OB History    No data available       Home Medications    Prior to Admission medications   Medication Sig Start Date End Date Taking? Authorizing Provider  Armodafinil 150 MG tablet Take 150 mg by mouth at bedtime as needed. Shift work. 11/27/15  Yes Historical Provider, MD  fexofenadine (ALLEGRA) 180 MG tablet Take 180 mg by mouth daily.   Yes Historical Provider, MD  folic acid (FOLVITE) 800 MCG tablet Take 800 mcg by mouth daily.   Yes Historical Provider, MD  mometasone (NASONEX) 50 MCG/ACT nasal spray Place 1 spray into the nose daily. 11/27/15  Yes Historical Provider, MD  Multiple Vitamin (MULTIVITAMIN WITH MINERALS) TABS tablet Take 2 tablets by mouth daily.   Yes Historical Provider,  MD  NUVARING 0.12-0.015 MG/24HR vaginal ring Place 1 Units vaginally every 21 ( twenty-one) days. As directed. 11/27/15  Yes Historical Provider, MD  Olopatadine HCl (PAZEO) 0.7 % SOLN Apply 1 drop to eye daily.   Yes Historical Provider, MD  sertraline (ZOLOFT) 50 MG tablet Take 50 mg by mouth daily. 11/27/15  Yes Historical Provider, MD  triamcinolone cream (KENALOG) 0.1 % Apply 1 application topically daily as needed (eczema).   Yes Historical Provider, MD    Family History History reviewed. No pertinent family history.  Social History Social History  Substance Use Topics  . Smoking status: Never Smoker  . Smokeless tobacco: Never Used  . Alcohol use Yes     Comment: OCCASIONAL     Allergies   Review of patient's allergies indicates no known allergies.   Review of Systems Review of Systems  Constitutional: Negative for fever.  HENT: Negative for congestion.   Eyes: Negative for visual disturbance.  Respiratory: Positive for shortness of breath. Negative for cough and chest tightness.   Cardiovascular: Negative for chest pain, palpitations and leg swelling.  Gastrointestinal: Negative for abdominal pain, nausea and vomiting.  Genitourinary: Negative for dysuria.  Musculoskeletal: Positive for myalgias.  Skin: Negative for color change.  Neurological: Negative for headaches.     Physical Exam Updated Vital Signs BP 112/65 (BP  Location: Left Arm)   Pulse 79   Temp 98.3 F (36.8 C) (Oral)   Resp 16   Ht 5\' 5"  (1.651 m)   Wt 61.1 kg   LMP 02/06/2016   SpO2 100%   BMI 22.40 kg/m   Physical Exam  Constitutional: She is oriented to person, place, and time. She appears well-developed and well-nourished. No distress.  HENT:  Head: Normocephalic and atraumatic.  Cardiovascular: Normal rate, regular rhythm and normal heart sounds.   No murmur heard. 2+ DP bilaterally.   Pulmonary/Chest: Effort normal and breath sounds normal. No respiratory distress.  Abdominal:  Soft. She exhibits no distension. There is no tenderness.  Musculoskeletal:  Bilateral lower extremities with full range of motion. No swelling. Patient does have tenderness to palpation behind the left knee and medial thigh. No overlying erythema or warmth.  Neurological: She is alert and oriented to person, place, and time.  Skin: Skin is warm and dry.  Nursing note and vitals reviewed.    ED Treatments / Results  Labs (all labs ordered are listed, but only abnormal results are displayed) Labs Reviewed  BASIC METABOLIC PANEL  CBC  URINALYSIS, ROUTINE W REFLEX MICROSCOPIC (NOT AT [facility])  HCG, QUANTITATIVE, PREGNANCY  I-STAT TROPOININ, ED    EKG  EKG Interpretation None       Radiology Dg Chest 2 View  Result Date: 02/20/2016 CLINICAL DATA:  Chest pain and dyspnea for 3 days EXAM: CHEST  2 VIEW COMPARISON:  Lung bases from a CT abdomen dated 11/22/2014 FINDINGS: The heart size and mediastinal contours are within normal limits. Both lungs are clear. The visualized skeletal structures are unremarkable. IMPRESSION: No active cardiopulmonary disease. Electronically Signed   By: Tollie Ethavid  Kwon M.D.   On: 02/20/2016 14:21    Procedures Procedures (including critical care time)  Medications Ordered in ED Medications - No data to display   Initial Impression / Assessment and Plan / ED Course  I have reviewed the triage vital signs and the nursing notes.  Pertinent labs & imaging results that were available during my care of the patient were reviewed by me and considered in my medical decision making (see chart for details).  Clinical Course   Ida RogueMaria M Leatham is a 35 y.o. female who presents to ED for left leg pain x 5 days after a long drive to and from the beach last weekend. On exam, LLE is NVI. She does have tenderness behind the knee and along medial thigh with no overlying skin changes or swelling. DVT study negative. Evaluation does not show pathology that would require  ongoing emergent intervention or inpatient treatment. Patient is hemodynamically stable and safe for discharge. Discussed findings and plan to follow up with PCP if symptoms persist. Return precautions discussed and all questions answered.   Final Clinical Impressions(s) / ED Diagnoses   Final diagnoses:  None    New Prescriptions New Prescriptions   No medications on file     [facility] MississippiLLCJaime Pilcher Ward, PA-C 02/20/16 1944    Laurence Spatesachel Morgan Little, MD 02/22/16 [id number]

## 2016-02-20 NOTE — Progress Notes (Signed)
*  PRELIMINARY RESULTS* Vascular Ultrasound Lower extremity venous duplex has been completed.  Preliminary findings: No evidence of DVT or baker's cyst.  Farrel DemarkJill Eunice, RDMS, RVT  02/20/2016, 7:40 PM

## 2016-02-20 NOTE — Discharge Instructions (Signed)
Follow up with your primary care provider if symptoms are not improving by Monday. Return to ER for new or worsening symptoms, any additional concerns.

## 2016-02-22 ENCOUNTER — Encounter: Payer: Self-pay | Admitting: Family Medicine

## 2016-03-25 MED FILL — SERTRALINE HCL 50 MG TABLET: 50 | 30 days supply | Qty: 30 | Fill #1

## 2016-03-25 MED FILL — NUVARING VAGINAL RING: 0.12-0.015 | 28 days supply | Qty: 1 | Fill #1 | Status: TO

## 2016-03-25 MED FILL — MOMETASONE FUROATE 50 MCG S: 50 | 30 days supply | Qty: 17 | Fill #0

## 2016-04-12 ENCOUNTER — Other Ambulatory Visit: Payer: Managed Care, Other (non HMO)

## 2016-04-13 ENCOUNTER — Other Ambulatory Visit (INDEPENDENT_AMBULATORY_CARE_PROVIDER_SITE_OTHER): Payer: Managed Care, Other (non HMO)

## 2016-04-13 DIAGNOSIS — Z0000 Encounter for general adult medical examination without abnormal findings: Secondary | ICD-10-CM | POA: Diagnosis not present

## 2016-04-13 LAB — HEPATIC FUNCTION PANEL
ALK PHOS: 57 U/L (ref 39–117)
ALT: 13 U/L (ref 0–35)
AST: 14 U/L (ref 0–37)
Albumin: 4 g/dL (ref 3.5–5.2)
BILIRUBIN TOTAL: 0.5 mg/dL (ref 0.2–1.2)
Bilirubin, Direct: 0.1 mg/dL (ref 0.0–0.3)
Total Protein: 6.8 g/dL (ref 6.0–8.3)

## 2016-04-13 LAB — POC URINALSYSI DIPSTICK (AUTOMATED)
BILIRUBIN UA: NEGATIVE
Blood, UA: NEGATIVE
GLUCOSE UA: NEGATIVE
Ketones, UA: NEGATIVE
LEUKOCYTES UA: NEGATIVE
NITRITE UA: NEGATIVE
Protein, UA: NEGATIVE
Spec Grav, UA: 1.01
Urobilinogen, UA: 0.2
pH, UA: 5.5

## 2016-04-13 LAB — BASIC METABOLIC PANEL
BUN: 10 mg/dL (ref 6–23)
CALCIUM: 9.4 mg/dL (ref 8.4–10.5)
CO2: 26 mEq/L (ref 19–32)
Chloride: 105 mEq/L (ref 96–112)
Creatinine, Ser: 0.6 mg/dL (ref 0.40–1.20)
GFR: 119.38 mL/min (ref >60.00)
GLUCOSE: 86 mg/dL (ref 70–99)
Potassium: 4.5 mEq/L (ref 3.5–5.1)
Sodium: 137 mEq/L (ref 135–145)

## 2016-04-13 LAB — LIPID PANEL
CHOL/HDL RATIO: 3
Cholesterol: 217 mg/dL — ABNORMAL HIGH (ref 0–200)
HDL: 73.2 mg/dL (ref >39.00)
LDL CALC: 126 mg/dL — AB (ref 0–99)
NONHDL: 143.57
Triglycerides: 87 mg/dL (ref 0.0–149.0)
VLDL: 17.4 mg/dL (ref 0.0–40.0)

## 2016-04-13 LAB — TSH: TSH: 0.17 u[IU]/mL — AB (ref 0.35–4.50)

## 2016-04-14 LAB — CBC WITH DIFFERENTIAL/PLATELET
BASOS ABS: 0 10*3/uL (ref 0.0–0.1)
Basophils Relative: 0.5 % (ref 0.0–3.0)
Eosinophils Absolute: 0.2 10*3/uL (ref 0.0–0.7)
Eosinophils Relative: 3.7 % (ref 0.0–5.0)
HCT: 38.8 % (ref 36.0–46.0)
HEMOGLOBIN: 13.5 g/dL (ref 12.0–15.0)
LYMPHS ABS: 1.8 10*3/uL (ref 0.7–4.0)
Lymphocytes Relative: 40.1 % (ref 12.0–46.0)
MCHC: 34.8 g/dL (ref 30.0–36.0)
MCV: 93 fl (ref 78.0–100.0)
MONO ABS: 0.5 10*3/uL (ref 0.1–1.0)
MONOS PCT: 10.6 % (ref 3.0–12.0)
Neutro Abs: 2 10*3/uL (ref 1.4–7.7)
Neutrophils Relative %: 45.1 % (ref 43.0–77.0)
Platelets: 228 10*3/uL (ref 150.0–400.0)
RBC: 4.17 Mil/uL (ref 3.87–5.11)
RDW: 12.8 % (ref 11.5–15.5)
WBC: 4.4 10*3/uL (ref 4.0–10.5)

## 2016-04-19 ENCOUNTER — Ambulatory Visit (INDEPENDENT_AMBULATORY_CARE_PROVIDER_SITE_OTHER): Payer: Managed Care, Other (non HMO) | Admitting: Family Medicine

## 2016-04-19 ENCOUNTER — Encounter: Payer: Self-pay | Admitting: Family Medicine

## 2016-04-19 VITALS — BP 84/60 | HR 76 | Resp 12 | Ht 65.0 in | Wt 129.1 lb

## 2016-04-19 DIAGNOSIS — Z0000 Encounter for general adult medical examination without abnormal findings: Secondary | ICD-10-CM

## 2016-04-19 DIAGNOSIS — E7800 Pure hypercholesterolemia, unspecified: Secondary | ICD-10-CM

## 2016-04-19 DIAGNOSIS — R946 Abnormal results of thyroid function studies: Secondary | ICD-10-CM

## 2016-04-19 DIAGNOSIS — R7989 Other specified abnormal findings of blood chemistry: Secondary | ICD-10-CM

## 2016-04-19 LAB — T3, FREE: T3 FREE: 4.4 pg/mL — AB (ref 2.3–4.2)

## 2016-04-19 LAB — T4, FREE: Free T4: 1.33 ng/dL (ref 0.60–1.60)

## 2016-04-19 LAB — TSH: TSH: 0.07 u[IU]/mL — ABNORMAL LOW (ref 0.35–4.50)

## 2016-04-19 NOTE — Progress Notes (Signed)
HPI:   Brianna Mckinney is a 35 y.o. female, who is here today for her routine physical.  I saw her in 12/2015 and since her last OV she has been In acute care a couple times, symptoms have resolved.   She does not exercise regularly but she follows a healthy diet.  She lives with boyfriend  Chronic medical problems: depression and anxiety, hyperinsomnia. She follows with psychiatrists.   Pap smear 05/2014 Hx of abnormal pap smears: Denies Hx of STD's : Denies  She is on Novaring for about 10 years. G:0  M:12 LMP 04/06/16.    FHx for gynecologic or colon cancer negative.  She has no concerns today.     Hx of HLD, statin medication was discontinue a few months ago and now she is on non pharmacologic treatment. She had labs recently.  Lab Results  Component Value Date   CREATININE 0.60 04/13/2016   BUN 10 04/13/2016   NA 137 04/13/2016   K 4.5 04/13/2016   CL 105 04/13/2016   CO2 26 04/13/2016   Lab Results  Component Value Date   ALT 13 04/13/2016   AST 14 04/13/2016   ALKPHOS 57 04/13/2016   BILITOT 0.5 04/13/2016   Lab Results  Component Value Date   WBC 4.4 04/13/2016   HGB 13.5 04/13/2016   HCT 38.8 04/13/2016   MCV 93.0 04/13/2016   PLT 228.0 04/13/2016   Lab Results  Component Value Date   TSH 0.17 (L) 04/13/2016   Lab Results  Component Value Date   CHOL 217 (H) 04/13/2016   HDL 73.20 04/13/2016   LDLCALC 126 (H) 04/13/2016   LDLDIRECT 135.3 03/30/2013   TRIG 87.0 04/13/2016   CHOLHDL 3 04/13/2016   She denies prior Hx of thyroid problems. She has not noted abnormal wt loss, neck pain, tremor, diarrhea, or hot intolerance.    Review of Systems  Constitutional: Positive for fatigue (no more than usual). Negative for appetite change, fever and unexpected weight change.  HENT: Negative for dental problem, hearing loss, mouth sores, nosebleeds, trouble swallowing and voice change.   Eyes: Negative for photophobia, pain and  visual disturbance.  Respiratory: Negative for cough, shortness of breath and wheezing.   Cardiovascular: Negative for chest pain and leg swelling.  Gastrointestinal: Negative for abdominal pain, blood in stool, nausea and vomiting.       No changes in bowel habits.  Endocrine: Negative for cold intolerance, heat intolerance, polydipsia, polyphagia and polyuria.  Genitourinary: Negative for decreased urine volume, dysuria, hematuria, menstrual problem, vaginal bleeding and vaginal discharge.       No breast tenderness or nipple discharge.  Musculoskeletal: Negative for arthralgias, back pain and myalgias.  Skin: Negative for rash.  Neurological: Negative for syncope, weakness, numbness and headaches.  Hematological: Negative for adenopathy. Does not bruise/bleed easily.  Psychiatric/Behavioral: Negative for confusion and sleep disturbance. The patient is nervous/anxious.   All other systems reviewed and are negative.     Current Outpatient Prescriptions on File Prior to Visit  Medication Sig Dispense Refill  . Armodafinil (NUVIGIL) 150 MG tablet Take 75-150 mg by mouth daily as needed (for alertness).    Marland Kitchen etonogestrel-ethinyl estradiol (NUVARING) 0.12-0.015 MG/24HR vaginal ring Place 1 each vaginally See admin instructions. Insert vaginally and leave in place for 3 consecutive weeks, then remove for 1 week. 3 each PRN  . fexofenadine (ALLEGRA) 180 MG tablet Take 180 mg by mouth daily.    . folic acid (  FOLVITE) 800 MCG tablet Take 400 mcg by mouth daily.    Marland Kitchen. LORazepam (ATIVAN) 1 MG tablet Take 1/2-1 tablet by mouth at bedtime prior to sleep.    . mometasone (NASONEX) 50 MCG/ACT nasal spray INSTILL 2 SPRAYS INTO THE NOSE DAILY. 17 g 6  . Multiple Vitamin (MULTIVITAMIN WITH MINERALS) TABS tablet Take 2 tablets by mouth daily.    . Olopatadine HCl (PAZEO) 0.7 % SOLN Apply to eye. Apply 1 drop to both eyes daily.    . sertraline (ZOLOFT) 50 MG tablet TAKE 1 TABLET BY MOUTH DAILY. 30 tablet 6   . triamcinolone cream (KENALOG) 0.1 % Apply 1 application topically 2 (two) times daily. (Patient taking differently: Apply 1 application topically 2 (two) times daily as needed (for ezcema.). ) 30 g 0  . triamcinolone cream (KENALOG) 0.1 % Apply 1 application topically daily as needed (eczema).     No current facility-administered medications on file prior to visit.      Past Medical History:  Diagnosis Date  . Allergy   . Depression   . Hyperlipidemia   . UTI (lower urinary tract infection)     No Known Allergies  Family History  Problem Relation Age of Onset  . Sudden death Mother     suicide  . Depression Mother   . Arthritis Father     RA    Social History   Social History  . Marital status: Single    Spouse name: N/A  . Number of children: N/A  . Years of education: N/A   Social History Main Topics  . Smoking status: Never Smoker  . Smokeless tobacco: Never Used  . Alcohol use Yes     Comment: OCCASIONAL  . Drug use: No  . Sexual activity: Yes    Birth control/ protection: Other-see comments, Inserts     Comment: Novaring   Other Topics Concern  . None   Social History Narrative   ** Merged History Encounter **         Vitals:   04/19/16 0902  BP: (!) 84/60  Pulse: 76  Resp: 12   Body mass index is 21.49 kg/m.  O2 sat at RA 96%  Wt Readings from Last 3 Encounters:  04/19/16 129 lb 2 oz (58.6 kg)  02/20/16 134 lb 9.6 oz (61.1 kg)  12/19/15 131 lb 3 oz (59.5 kg)      Physical Exam  Nursing note and vitals reviewed. Constitutional: She is oriented to person, place, and time. She appears well-developed and well-nourished. No distress.  HENT:  Head: Atraumatic.  Right Ear: Hearing, tympanic membrane, external ear and ear canal normal.  Left Ear: Hearing, tympanic membrane, external ear and ear canal normal.  Mouth/Throat: Uvula is midline, oropharynx is clear and moist and mucous membranes are normal.  Eyes: Conjunctivae and EOM are  normal. Pupils are equal, round, and reactive to light.  Neck: No tracheal deviation present. No thyroid mass and no thyromegaly present.  Cardiovascular: Normal rate and regular rhythm.   No murmur heard. Pulses:      Dorsalis pedis pulses are 2+ on the right side, and 2+ on the left side.       Posterior tibial pulses are 2+ on the right side, and 2+ on the left side.  Respiratory: Effort normal and breath sounds normal. No respiratory distress.  GI: Soft. She exhibits no mass. There is no hepatomegaly. There is no tenderness.  Genitourinary: No breast swelling, tenderness or discharge.  Genitourinary Comments: Fibrocystic breast changes bilateral, other upper quadrant. No nipple discharge appreciated.  Musculoskeletal: She exhibits no edema or tenderness.  No major deformity or signs  of synovitis appreciated.  Lymphadenopathy:    She has no cervical adenopathy.    She has no axillary adenopathy.       Right: No supraclavicular adenopathy present.       Left: No supraclavicular adenopathy present.  Neurological: She is alert and oriented to person, place, and time. She has normal strength. No cranial nerve deficit. Coordination and gait normal.  Reflex Scores:      Bicep reflexes are 2+ on the right side and 2+ on the left side.      Patellar reflexes are 2+ on the right side and 2+ on the left side. Skin: Skin is warm. No rash noted. No erythema.  Psychiatric: She has a normal mood and affect. Her speech is normal.  Well groomed, good eye contact.      ASSESSMENT AND PLAN:      Brianna Mckinney was seen today for annual exam.  Diagnoses and all orders for this visit:  Routine general medical examination at a health care facility   We discussed the importance of regular physical activity and healthy diet for prevention of chronic illness and/or complications. Preventive guidelines reviewed. Pap smear in 1-2 years. Vaccination up to date.  Next CPE in 1-2 years.    Abnormal  TSH  Asymptomatic. We discussed possible causes, further workup ordered today and recommendations would be given accordingly. He abnormality persist she may need to be evaluated by endocrinologist.  -     TSH -     T4, free -     T3, free -     Thyroid Peroxidase Antibody -     Thyroid stimulating immunoglobulin   Pure hypercholesterolemia  Mild. She will continue low fat diet. Follow-up in one year.    -She will continue following with psychiatrist for her other chronic medical problems.     Return in 1 year (on 04/19/2017).          Slaton Reaser G. Swazil[place]Jordan, MD  [facility]. [place] office.

## 2016-04-19 NOTE — Patient Instructions (Signed)
A few things to remember from today's visit:   Abnormal TSH - Plan: TSH, T4, free, T3, free, Thyroid Peroxidase Antibody  Routine general medical examination at a health care facility    At least 150 minutes of moderate exercise per week, daily brisk walking for 15-30 min is a good exercise option. Healthy diet low in saturated (animal) fats and sweets and consisting of fresh fruits and vegetables, lean meats such as fish and white chicken and whole grains.   - Vaccines:  Tdap vaccine every 10 years.  Shingles vaccine recommended at age 37, could be given after 37 years of age but not sure about insurance coverage.  Pneumonia vaccines:  Prevnar 13 at 65 and Pneumovax at 66.  Screening recommendations for low/normal risk women:  Screening for diabetes at age 37-45 and every 3 years.  Cervical cancer prevention:  -HPV vaccination between 229-37 years old. -Pap smear starts at 37 years of age and continues periodically until 37 years old in low risk women. Pap smear every 3 years between 5021 and 348 years old. Pap smear every 3 years between women 30 and older if pap smear negative and HPV screening negative.   -Breast cancer: Mammogram: There is disagreement between experts about when to start screening in low risk asymptomatic female but recent recommendations are to start screening at 9840 and not later than 37 years old , every 1-2 years and after 37 yo q 2 years. Screening is recommended until 37 years old but some women can continue screening depending of healthy issues.   Colon cancer screening: starts at 37 years old until 37 years old.  Cholesterol disorder screening at age 545 and every 3 years.    Please be sure medication list is accurate. If a new problem present, please set up appointment sooner than planned today.

## 2016-04-19 NOTE — Progress Notes (Signed)
Pre visit review using our clinic review tool, if applicable. No additional management support is needed unless otherwise documented below in the visit note. 

## 2016-04-20 LAB — THYROID PEROXIDASE ANTIBODY: Thyroperoxidase Ab SerPl-aCnc: 2 IU/mL (ref <9)

## 2016-04-23 ENCOUNTER — Telehealth: Payer: Self-pay | Admitting: Family Medicine

## 2016-04-23 NOTE — Telephone Encounter (Signed)
See message please

## 2016-04-23 NOTE — Telephone Encounter (Signed)
Pt state that she noticed her TSH was lower and want to know what she needs to do from this point.

## 2016-04-24 LAB — THYROID STIMULATING IMMUNOGLOBULIN: TSI: 399 % baseline — ABNORMAL HIGH (ref <140)

## 2016-04-25 ENCOUNTER — Other Ambulatory Visit: Payer: Self-pay | Admitting: Family Medicine

## 2016-04-25 DIAGNOSIS — E0590 Thyrotoxicosis, unspecified without thyrotoxic crisis or storm: Secondary | ICD-10-CM

## 2016-04-25 NOTE — Telephone Encounter (Signed)
I was waiting for all labs to be back. I sent you lab results note. ? Graves disease, referral to endocrinologists placed. Thanks, BJ

## 2016-04-26 NOTE — Telephone Encounter (Signed)
Tried contacting patient, VM full. See lab note.

## 2016-05-05 MED FILL — ARMODAFINIL 150 MG TABLET: 150 | 30 days supply | Qty: 30 | Fill #0

## 2016-05-05 MED FILL — SERTRALINE HCL 50 MG TABLET: 50 | 30 days supply | Qty: 30 | Fill #2

## 2016-05-05 MED FILL — NUVARING VAGINAL RING: 0.12-0.015 | 28 days supply | Qty: 1 | Fill #2 | Status: TO

## 2016-05-05 MED FILL — MOMETASONE FUROATE 50 MCG S: 50 | 30 days supply | Qty: 17 | Fill #1

## 2016-05-28 ENCOUNTER — Ambulatory Visit (INDEPENDENT_AMBULATORY_CARE_PROVIDER_SITE_OTHER): Payer: Managed Care, Other (non HMO) | Admitting: Internal Medicine

## 2016-05-28 ENCOUNTER — Encounter: Payer: Self-pay | Admitting: Internal Medicine

## 2016-05-28 VITALS — BP 118/72 | HR 97 | Ht 65.5 in | Wt 127.0 lb

## 2016-05-28 DIAGNOSIS — E0580 Other thyrotoxicosis without thyrotoxic crisis or storm: Secondary | ICD-10-CM

## 2016-05-28 DIAGNOSIS — E0500 Thyrotoxicosis with diffuse goiter without thyrotoxic crisis or storm: Secondary | ICD-10-CM | POA: Insufficient documentation

## 2016-05-28 LAB — T3, FREE: T3 FREE: 6.1 pg/mL — AB (ref 2.3–4.2)

## 2016-05-28 LAB — TSH: TSH: 0.01 u[IU]/mL — ABNORMAL LOW (ref 0.35–4.50)

## 2016-05-28 LAB — T4, FREE: FREE T4: 1.65 ng/dL — AB (ref 0.60–1.60)

## 2016-05-28 MED ORDER — METHIMAZOLE 5 MG PO TABS: 5.0000 mg | ORAL_TABLET | Freq: Two times a day (BID) | ORAL | 2 refills | Status: DC

## 2016-05-28 NOTE — Progress Notes (Signed)
Patient ID: Brianna Mckinney, female   DOB: Oct 03, 1980, 36 y.o.   MRN: [id number]    HPI  Brianna Mckinney is a 36 y.o.-year-old female, referred by her PCP, Dr. [place], for evaluation for thyrotoxicosis.  She has several stressors in the last year: Her father died, also, she has relationship problems.  She had a URI but developed this after the first set of thyroid tests returned abnormal.  I reviewed pt's thyroid tests: Lab Results  Component Value Date   TSH 0.07 (L) 04/19/2016   TSH 0.17 (L) 04/13/2016   TSH 1.61 06/12/2014   TSH 0.78 01/09/2014   TSH 1.50 10/25/2012   TSH 2.45 07/30/2011   FREET4 1.33 04/19/2016    Graves antibodies were elevated: Lab Results  Component Value Date   TSI 399 (H) 04/19/2016   Pt denies feeling nodules in neck, hoarseness, dysphagia/odynophagia, SOB with lying down; she c/o: - + fatigue, + normal sleep (but takes Ativan at night) - + pbs with concentration - + worse anxiety - no excessive sweating/heat intolerance - + tremors - + occasionally palpitations - with anxiety - no hyperdefecation - + weight loss (8 lbs in last 3 mo), despite increased appetite but eating more - no hair loss  She had flu in 04/2016.  Pt does not have a FH of thyroid ds: M aunt and MGM - hypothyroidism. No FH of thyroid cancer. No h/o radiation tx to head or neck.  No seaweed or kelp, no recent contrast studies. No steroid use. No herbal supplements. No Biotin use.  Pt. also has a history of depression/anxiety, HL, colitis - hosp. In 2016, h/o conjunctivitis.  ROS: Constitutional: + see HPI Eyes: no blurry vision, no xerophthalmia ENT: no sore throat, + See history of present illness Cardiovascular: no CP/SOB/+ Occasional palpitations/no leg swelling Respiratory: no cough/SOB Gastrointestinal: no N/V/D/C Musculoskeletal: no muscle/joint aches Skin: no rashes Neurological: + tremors/no numbness/tingling/dizziness Psychiatric: + depression/+ anxiety  Past  Medical History:  Diagnosis Date  . Allergy   . Depression   . Hyperlipidemia   . UTI (lower urinary tract infection)    Past Surgical History:  Procedure Laterality Date  . EYE SURGERY     right eye  . EYE SURGERY     LEFT LASER  . REFRACTIVE SURGERY    . WISDOM TOOTH EXTRACTION     Social History   Social History  . Marital status: Single    Spouse name: N/A  . Number of children: 0   Occupational History  . RN    Social History Main Topics  . Smoking status: Never Smoker  . Smokeless tobacco: Never Used  . Alcohol use Yes     Comment: OCCASIONAL  . Drug use: No  . Sexual activity: Yes    Birth control/ protection: Other-see comments, Inserts     Comment: Nuvaring   Current Outpatient Prescriptions on File Prior to Visit  Medication Sig Dispense Refill  . Armodafinil (NUVIGIL) 150 MG tablet Take 75-150 mg by mouth daily as needed (for alertness).    Marland Kitchen etonogestrel-ethinyl estradiol (NUVARING) 0.12-0.015 MG/24HR vaginal ring Place 1 each vaginally See admin instructions. Insert vaginally and leave in place for 3 consecutive weeks, then remove for 1 week. 3 each PRN  . fexofenadine (ALLEGRA) 180 MG tablet Take 180 mg by mouth daily.    . folic acid (FOLVITE) 800 MCG tablet Take 400 mcg by mouth daily.    Marland Kitchen LORazepam (ATIVAN) 1 MG tablet Take 1/2-1 tablet  by mouth at bedtime prior to sleep.    . mometasone (NASONEX) 50 MCG/ACT nasal spray INSTILL 2 SPRAYS INTO THE NOSE DAILY. 17 g 6  . Multiple Vitamin (MULTIVITAMIN WITH MINERALS) TABS tablet Take 2 tablets by mouth daily.    . Olopatadine HCl (PAZEO) 0.7 % SOLN Apply to eye. Apply 1 drop to both eyes daily.    . sertraline (ZOLOFT) 50 MG tablet TAKE 1 TABLET BY MOUTH DAILY. 30 tablet 6  . triamcinolone cream (KENALOG) 0.1 % Apply 1 application topically 2 (two) times daily. (Patient taking differently: Apply 1 application topically 2 (two) times daily as needed (for ezcema.). ) 30 g 0   No current  facility-administered medications on file prior to visit.    No Known Allergies Family History  Problem Relation Age of Onset  . Sudden death Mother     suicide  . Depression Mother   . Arthritis Father     RA   Also: - Diabetes in MGM - HTN in father - HL in mother, aunts, grandmother, father - Heart disease in MGM - Cancer in PGM  PE: BP 118/72 (BP Location: Left Arm, Patient Position: Sitting)   Pulse 97   Ht 5' 5.5" (1.664 m)   Wt 127 lb (57.6 kg)   LMP 05/01/2016   SpO2 96%   BMI 20.81 kg/m   Wt Readings from Last 3 Encounters:  05/28/16 127 lb (57.6 kg)  04/19/16 129 lb 2 oz (58.6 kg)  02/20/16 134 lb 9.6 oz (61.1 kg)   Constitutional: Normal weight, in NAD Eyes: PERRLA, EOMI, slight bilateral exophthalmos, no lid lag, no stare ENT: moist mucous membranes, no thyromegaly, no thyroid bruits, + bilateral upper cervical lymphadenopathy Cardiovascular: RRR, No MRG Respiratory: CTA B Gastrointestinal: abdomen soft, NT, ND, BS+ Musculoskeletal: no deformities, strength intact in all 4 Skin: moist, warm, no rashes Neurological: + Very faint tremor with outstretched hands, DTR normal in all 4  ASSESSMENT: 1. Thyrotoxicosis  PLAN:  1. Patient with a recently found low TSH and high free T3, with thyrotoxic sxs: weight loss, tremors, anxiety, palpitations. She developed symptoms after a particularly stressful year. First abnormal TSH was in 03/2016, and the second was less than a week later, and returned even lower. TSI antibodies were elevated at that time. At that point, she was referred to endocrinology. - she does not appear to have exogenous causes for the low TSH.  - We discussed that possible causes of thyrotoxicosis are:  Graves ds  (Most likely) Thyroiditis toxic multinodular goiter/ toxic adenoma (I cannot feel nodules at palpation of her thyroid).This would be unusual in the setting of elevated TSI antibodies - I suggested that we check the TSH, fT3 and fT4  again today and advised her that we may need to start treatment if the tests are worse. - we may  also need an uptake and scan to differentiate between the 3 above possible etiologies  - we discussed about possible modalities of treatment for the above conditions, to include methimazole use, radioactive iodine ablation or (last resort) surgery. - Iwould have suggested to add beta blockers since she is  tachycardic, anxious,and mildly  tremulous, However, she tells me that her blood pressure can be quite low, and per review of records from last visit with PCP, she was in the 80s systolic. Therefore, will hold off adding beta blocker for now, however, I advised her to let me know if the pulse remains high and her blood pressure is  higher than 100 systolic. - RTC in 3 months, but likely sooner for repeat labs  Office Visit on 05/28/2016  Component Date Value Ref Range Status  . Free T4 05/28/2016 1.65* 0.60 - 1.60 ng/dL Final   Comment: Specimens from patients who are undergoing biotin therapy and /or ingesting biotin supplements may contain high levels of biotin.  The higher biotin concentration in these specimens interferes with this Free T4 assay.  Specimens that contain high levels  of biotin may cause false high results for this Free T4 assay.  Please interpret results in light of the total clinical presentation of the patient.    . T3, Free 05/28/2016 6.1* 2.3 - 4.2 pg/mL Final  . TSH 05/28/2016 0.01* 0.35 - 4.50 uIU/mL Final   TFTs are worse >> will start methimazole 5 mg twice a day and repeat the tests in 4-5 weeks. Most likely Graves' disease. I do not think we would get much value from checking an uptake and scan for now.   Brianna Pavlovristina Inetha Maret, MD PhD [facility]

## 2016-05-28 NOTE — Patient Instructions (Addendum)
Please stop at the lab.  Please return in 3 months.    Hyperthyroidism Hyperthyroidism is when the thyroid is too active (overactive). Your thyroid is a large gland that is located in your neck. The thyroid helps to control how your body uses food (metabolism). When your thyroid is overactive, it produces too much of a hormone called thyroxine. What are the causes? Causes of hyperthyroidism may include:  Graves disease. This is when your immune system attacks the thyroid gland. This is the most common cause.  Inflammation of the thyroid gland.  Tumor in the thyroid gland or somewhere else.  Excessive use of thyroid medicines, including:  Prescription thyroid supplement.  Herbal supplements that mimic thyroid hormones.  Solid or fluid-filled lumps within your thyroid gland (thyroid nodules).  Excessive ingestion of iodine. What increases the risk?  Being female.  Having a family history of thyroid conditions. What are the signs or symptoms? Signs and symptoms of hyperthyroidism may include:  Nervousness.  Inability to tolerate heat.  Unexplained weight loss.  Diarrhea.  Change in the texture of hair or skin.  Heart skipping beats or making extra beats.  Rapid heart rate.  Loss of menstruation.  Shaky hands.  Fatigue.  Restlessness.  Increased appetite.  Sleep problems.  Enlarged thyroid gland or nodules. How is this diagnosed? Diagnosis of hyperthyroidism may include:  Medical history and physical exam.  Blood tests.  Ultrasound tests. How is this treated? Treatment may include:  Medicines to control your thyroid.  Surgery to remove your thyroid.  Radiation therapy. Follow these instructions at home:  Take medicines only as directed by your health care provider.  Do not use any tobacco products, including cigarettes, chewing tobacco, or electronic cigarettes. If you need help quitting, ask your health care provider.  Do not exercise or  do physical activity until your health care provider approves.  Keep all follow-up appointments as directed by your health care provider. This is important. Contact a health care provider if:  Your symptoms do not get better with treatment.  You have fever.  You are taking thyroid replacement medicine and you:  Have depression.  Feel mentally and physically slow.  Have weight gain. Get help right away if:  You have decreased alertness or a change in your awareness.  You have abdominal pain.  You feel dizzy.  You have a rapid heartbeat.  You have an irregular heartbeat. This information is not intended to replace advice given to you by your health care provider. Make sure you discuss any questions you have with your health care provider. Document Released: 05/03/2005 Document Revised: 10/02/2015 Document Reviewed: 09/18/2013 Elsevier Interactive Patient Education  2017 Elsevier Inc.  Methimazole tablets What is this medicine? METHIMAZOLE (meth IM a zole) prevents the thyroid gland from producing too much thyroid hormone. It is used to treat a condition known as hyperthyroidism. This medicine may be used for other purposes; ask your health care provider or pharmacist if you have questions. COMMON BRAND NAME(S): Northyx, Tapazole What should I tell my health care provider before I take this medicine? They need to know if you have any of these conditions: -bone marrow disease -liver disease -an unusual or allergic reaction to methimazole, other medicines, foods, dyes, or preservatives -pregnant or trying to get pregnant -breast-feeding How should I use this medicine? Take this medicine by mouth with a glass of water. Follow the directions on the prescription label. You can take this medicine with or without food. However, you should  always take it the same way to make sure the effects are the same. Take your doses at regular intervals. Do not take your medicine more often  than directed. Do not stop taking this medicine except on the advice of your doctor or health care professional. Talk to your pediatrician regarding the use of this medicine in children. Special care may be needed. While this drug may be prescribed for children for selected conditions, precautions do apply. Overdosage: If you think you have taken too much of this medicine contact a poison control center or emergency room at once. NOTE: This medicine is only for you. Do not share this medicine with others. What if I miss a dose? If you miss a dose, take it as soon as you can. If it is almost time for your next dose, take only that dose. Do not take double or extra doses. What may interact with this medicine? Do not take this medicine with any of the following medications: -sodium iodide -thyroid hormones This medicine may also interact with the following medications: -certain medicines for high blood pressure like metoprolol and propranolol -digoxin -theophylline -warfarin This list may not describe all possible interactions. Give your health care provider a list of all the medicines, herbs, non-prescription drugs, or dietary supplements you use. Also tell them if you smoke, drink alcohol, or use illegal drugs. Some items may interact with your medicine. What should I watch for while using this medicine? Visit your doctor or health care professional for regular checks on your progress. Your thyroid hormone levels will need to be checked. This medicine can reduce your resistance to infection. Contact your doctor or health care professional if you have any infection or injury. Avoid people who have colds, flu, bronchitis or other infectious disease. Do not have any vaccinations without asking your doctor or health care professional. Avoid people who have recently received oral polio vaccine. What side effects may I notice from receiving this medicine? Side effects that you should report to your  doctor or health care professional as soon as possible: -black, tarry stools -fever, sore throat, hoarseness -numbness or tingling in the hands or feet -severe redness or itching of the skin, or dry cracked skin -stomach pain -swelling of the feet or legs -unusual bleeding or bruising, pinpoint red spots on the skin -unusual or sudden weight increase -unusually weak or tired -yellowing of skin or eyes Side effects that usually do not require medical attention (report to your doctor or health care professional if they continue or are bothersome): -headache -nausea, vomiting -mild skin rash, itching -muscle aches and pains This list may not describe all possible side effects. Call your doctor for medical advice about side effects. You may report side effects to FDA at 1-800-FDA-1088. Where should I keep my medicine? Keep out of the reach of children. Store at room temperature between 15 and 30 degrees C (59 and 86 degrees F). Throw away any unused medicine after the expiration date. NOTE: This sheet is a summary. It may not cover all possible information. If you have questions about this medicine, talk to your doctor, pharmacist, or health care provider.  2017 Elsevier/Gold Standard (2007-11-20 15:59:43)

## 2016-05-31 ENCOUNTER — Other Ambulatory Visit: Payer: Self-pay

## 2016-05-31 ENCOUNTER — Telehealth: Payer: Self-pay | Admitting: Internal Medicine

## 2016-05-31 MED ORDER — METHIMAZOLE 5 MG PO TABS: 5.0000 mg | ORAL_TABLET | Freq: Two times a day (BID) | ORAL | 2 refills | Status: DC

## 2016-05-31 NOTE — Telephone Encounter (Signed)
RX sent to pharmacy requested  

## 2016-05-31 NOTE — Telephone Encounter (Signed)
Pt called back and said [facility] [place],  ([address])

## 2016-05-31 NOTE — Telephone Encounter (Signed)
Need medication methimazole (TAPAZOLE) 5 MG  tablet.  Will call back with the the pharmacy she want It sent to.

## 2016-06-10 MED FILL — LORazepam 1 MG TABS: 1 | 30 days supply | Qty: 30 | Fill #0

## 2016-06-10 MED FILL — SERTRALINE HCL 50 MG TABLET: 50 | 30 days supply | Qty: 45 | Fill #0 | Status: TO

## 2016-06-11 MED FILL — NUVARING VAGINAL RING: 0.12-0.015 | 28 days supply | Qty: 1 | Fill #3 | Status: TO

## 2016-06-11 MED FILL — MOMETASONE FUROATE 50 MCG S: 50 | 30 days supply | Qty: 17 | Fill #2 | Status: TO

## 2016-07-01 ENCOUNTER — Other Ambulatory Visit (INDEPENDENT_AMBULATORY_CARE_PROVIDER_SITE_OTHER): Payer: Managed Care, Other (non HMO)

## 2016-07-01 DIAGNOSIS — E0580 Other thyrotoxicosis without thyrotoxic crisis or storm: Secondary | ICD-10-CM

## 2016-07-01 LAB — T3, FREE: T3 FREE: 5.2 pg/mL — AB (ref 2.3–4.2)

## 2016-07-01 LAB — T4, FREE: Free T4: 1.28 ng/dL (ref 0.60–1.60)

## 2016-07-01 LAB — TSH: TSH: 0.06 u[IU]/mL — ABNORMAL LOW (ref 0.35–4.50)

## 2016-07-02 MED ORDER — METHIMAZOLE 5 MG PO TABS: 5.0000 mg | ORAL_TABLET | Freq: Three times a day (TID) | ORAL | 2 refills | Status: DC

## 2016-08-26 ENCOUNTER — Encounter: Payer: Self-pay | Admitting: Internal Medicine

## 2016-08-26 ENCOUNTER — Ambulatory Visit (INDEPENDENT_AMBULATORY_CARE_PROVIDER_SITE_OTHER): Payer: Managed Care, Other (non HMO) | Admitting: Internal Medicine

## 2016-08-26 VITALS — BP 114/64 | HR 80 | Wt 134.0 lb

## 2016-08-26 DIAGNOSIS — E0500 Thyrotoxicosis with diffuse goiter without thyrotoxic crisis or storm: Secondary | ICD-10-CM

## 2016-08-26 LAB — T3, FREE: T3, Free: 2.8 pg/mL (ref 2.3–4.2)

## 2016-08-26 LAB — TSH: TSH: 0.37 u[IU]/mL (ref 0.35–4.50)

## 2016-08-26 LAB — T4, FREE: FREE T4: 0.61 ng/dL (ref 0.60–1.60)

## 2016-08-26 NOTE — Progress Notes (Signed)
Patient ID: Brianna Mckinney, female   DOB: 10-Mar-1981, 36 y.o.   MRN: [id number]    HPI  Brianna Mckinney is a 36 y.o.-year-old female,returning for f/u for Graves ds..  Reviewed and addended hx: She has several stressors in the last year: Her father died, also, she has relationship problems.  TFTs were checked and they returned abnormal.  She had a URI but developed this after the first set of thyroid tests returned abnormal.  I reviewed pt's thyroid tests: Lab Results  Component Value Date   TSH 0.06 (L) 07/01/2016   TSH 0.01 (L) 05/28/2016   TSH 0.07 (L) 04/19/2016   TSH 0.17 (L) 04/13/2016   TSH 1.61 06/12/2014   TSH 0.78 01/09/2014   TSH 1.50 10/25/2012   TSH 2.45 07/30/2011   FREET4 1.28 07/01/2016   FREET4 1.65 (H) 05/28/2016   FREET4 1.33 04/19/2016    Graves antibodies were elevated: Lab Results  Component Value Date   TSI 399 (H) 04/19/2016   We started MMI 5 mg bid >> then increased to 3x a day in 06/2016. TFTs improved, but still abnormal at last check in 06/2016.  Pt denies feeling nodules in neck, hoarseness, dysphagia/odynophagia, SOB with lying down; she c/o: - + fatigue - improved anxiety - no excessive sweating/heat intolerance - resolved tremors - resolved palpitations  - no hyperdefecation - + initially weight loss (8 lbs in last 3 mo), now gained 7 lbs back  Pt does not have a FH of thyroid ds: M aunt and MGM - hypothyroidism. No FH of thyroid cancer. No h/o radiation tx to head or neck.  No seaweed or kelp, no recent contrast studies. No steroid use. No herbal supplements. No Biotin use.  Pt. also has a history of depression/anxiety, HL, colitis - hosp. In 2016, h/o conjunctivitis.  ROS: Constitutional: + see HPI, + nocturia  0-2 x a night Eyes: no blurry vision, no xerophthalmia ENT: no sore throat, + See history of present illness Cardiovascular: no CP/SOB/palpitations/no leg swelling Respiratory: no cough/SOB Gastrointestinal: no  N/V/D/C Musculoskeletal: + muscle/no joint aches Skin: no rashes Neurological: no tremors/no numbness/tingling/dizziness, + HA - occasionally  I reviewed pt's medications, allergies, PMH, social hx, family hx, and changes were documented in the history of present illness. Otherwise, unchanged from my initial visit note.  Past Medical History:  Diagnosis Date  . Allergy   . Depression   . Hyperlipidemia   . UTI (lower urinary tract infection)    Past Surgical History:  Procedure Laterality Date  . EYE SURGERY     right eye  . EYE SURGERY     LEFT LASER  . REFRACTIVE SURGERY    . WISDOM TOOTH EXTRACTION     Social History   Social History  . Marital status: Single    Spouse name: N/A  . Number of children: 0   Occupational History  . RN    Social History Main Topics  . Smoking status: Never Smoker  . Smokeless tobacco: Never Used  . Alcohol use Yes     Comment: OCCASIONAL  . Drug use: No  . Sexual activity: Yes    Birth control/ protection: Other-see comments, Inserts     Comment: Nuvaring   Current Outpatient Prescriptions on File Prior to Visit  Medication Sig Dispense Refill  . Armodafinil (NUVIGIL) 150 MG tablet Take 75-150 mg by mouth daily as needed (for alertness).    Marland Kitchen etonogestrel-ethinyl estradiol (NUVARING) 0.12-0.015 MG/24HR vaginal ring Place 1 each  vaginally See admin instructions. Insert vaginally and leave in place for 3 consecutive weeks, then remove for 1 week. 3 each PRN  . fexofenadine (ALLEGRA) 180 MG tablet Take 180 mg by mouth daily.    . folic acid (FOLVITE) 800 MCG tablet Take 400 mcg by mouth daily.    Marland Kitchen LORazepam (ATIVAN) 1 MG tablet Take 1/2-1 tablet by mouth at bedtime prior to sleep.    . methimazole (TAPAZOLE) 5 MG tablet Take 1 tablet (5 mg total) by mouth 3 (three) times daily. 75 tablet 2  . mometasone (NASONEX) 50 MCG/ACT nasal spray INSTILL 2 SPRAYS INTO THE NOSE DAILY. 17 g 6  . Multiple Vitamin (MULTIVITAMIN WITH MINERALS) TABS  tablet Take 2 tablets by mouth daily.    . Olopatadine HCl (PAZEO) 0.7 % SOLN Apply to eye. Apply 1 drop to both eyes daily.    . sertraline (ZOLOFT) 50 MG tablet TAKE 1 TABLET BY MOUTH DAILY. 30 tablet 6  . triamcinolone cream (KENALOG) 0.1 % Apply 1 application topically 2 (two) times daily. (Patient taking differently: Apply 1 application topically 2 (two) times daily as needed (for ezcema.). ) 30 g 0   No current facility-administered medications on file prior to visit.    No Known Allergies Family History  Problem Relation Age of Onset  . Sudden death Mother     suicide  . Depression Mother   . Arthritis Father     RA   Also: - Diabetes in MGM - HTN in father - HL in mother, aunts, grandmother, father - Heart disease in MGM - Cancer in PGM  PE: BP 114/64 (BP Location: Left Arm, Patient Position: Sitting)   Pulse 80   Wt 134 lb (60.8 kg)   LMP 08/03/2016   SpO2 98%   BMI 21.96 kg/m  Wt Readings from Last 3 Encounters:  08/26/16 134 lb (60.8 kg)  05/28/16 127 lb (57.6 kg)  04/19/16 129 lb 2 oz (58.6 kg)   Constitutional: Normal weight, in NAD Eyes: PERRLA, EOMI, slight bilateral exophthalmos, no lid lag, no stare ENT: moist mucous membranes, no thyromegaly, no thyroid bruits, no cervical lymphadenopathy Cardiovascular: RRR, No MRG Respiratory: CTA B Gastrointestinal: abdomen soft, NT, ND, BS+ Musculoskeletal: no deformities, strength intact in all 4 Skin: moist, warm, no rashes Neurological: no tremor with outstretched hands, DTR normal in all 4  ASSESSMENT: 1. Thyrotoxicosis  PLAN:  1. Patient with a recently found low TSH and high free T3, with thyrotoxic sxs: weight loss, tremors, anxiety, palpitations, developed symptoms after a particularly stressful year. TSI antibodies were elevated at that time. As labs were c/o Graves ds., we skipped the Uptake and scan >> started MMi 5 mg bid and then increase to tid 2 mo ago. She is feeling much better, with almost all  sxs resolved. - at last visit, I would have liked to add beta blockers since she is  tachycardic, anxious,and mildly  tremulous, However, she tells me that her blood pressure can be quite low, and per review of records from last visit with PCP, she was in the 80s systolic. Therefore, we did not add beta blockers then. Not needed today. - today we will check TFTs >> I think we can decrease MMI dose  - RTC in 4 months, but likely sooner for repeat labs  Needs refills.  Component     Latest Ref Rng & Units 08/26/2016  TSH     0.35 - 4.50 uIU/mL 0.37  T4,Free(Direct)  0.60 - 1.60 ng/dL 4.09  Triiodothyronine,Free,Serum     2.3 - 4.2 pg/mL 2.8   TFTs now normal >> will decrease MMI to 5 mg bid x 5 weeks, then recheck TFTs.  Carlus Pavlov, MD PhD [facility] Endocrinology

## 2016-08-26 NOTE — Patient Instructions (Addendum)
Please continue Methimazole 5 mg 3x a day with meal.  Please stop at the lab.  Please return in 4 months.

## 2016-08-27 MED ORDER — METHIMAZOLE 5 MG PO TABS: 5.0000 mg | ORAL_TABLET | Freq: Two times a day (BID) | ORAL | 3 refills | Status: DC

## 2016-09-03 ENCOUNTER — Other Ambulatory Visit: Payer: Self-pay | Admitting: Family Medicine

## 2016-09-03 DIAGNOSIS — Z309 Encounter for contraceptive management, unspecified: Secondary | ICD-10-CM

## 2016-09-07 ENCOUNTER — Other Ambulatory Visit: Payer: Self-pay | Admitting: Internal Medicine

## 2016-09-07 NOTE — Telephone Encounter (Signed)
Pt following up on request refill  etonogestrel-ethinyl estradiol (NUVARING) 0.12-0.015 MG/24HR vaginal ring  Last written by Amil Amen,  now need Dr [place] to refill since she switched to her.   [facility] [address], [place], [place] [address]      Phone: [phone] (This is a temp pharmacy)

## 2016-09-17 MED FILL — ARMODAFINIL 150 MG TABLET: 150 | 30 days supply | Qty: 30 | Fill #0

## 2016-10-05 MED FILL — LORazepam 1 MG TABS: 1 | 30 days supply | Qty: 30 | Fill #1

## 2016-10-14 IMAGING — CR DG CHEST 2V
2 series · 2 of 2 positions shown · non-contrast
Comparison: Lung bases from a CT abdomen dated 11/22/2014

CLINICAL DATA: Chest pain and dyspnea for 3 days

EXAM:
CHEST  2 VIEW

[w chest pa]
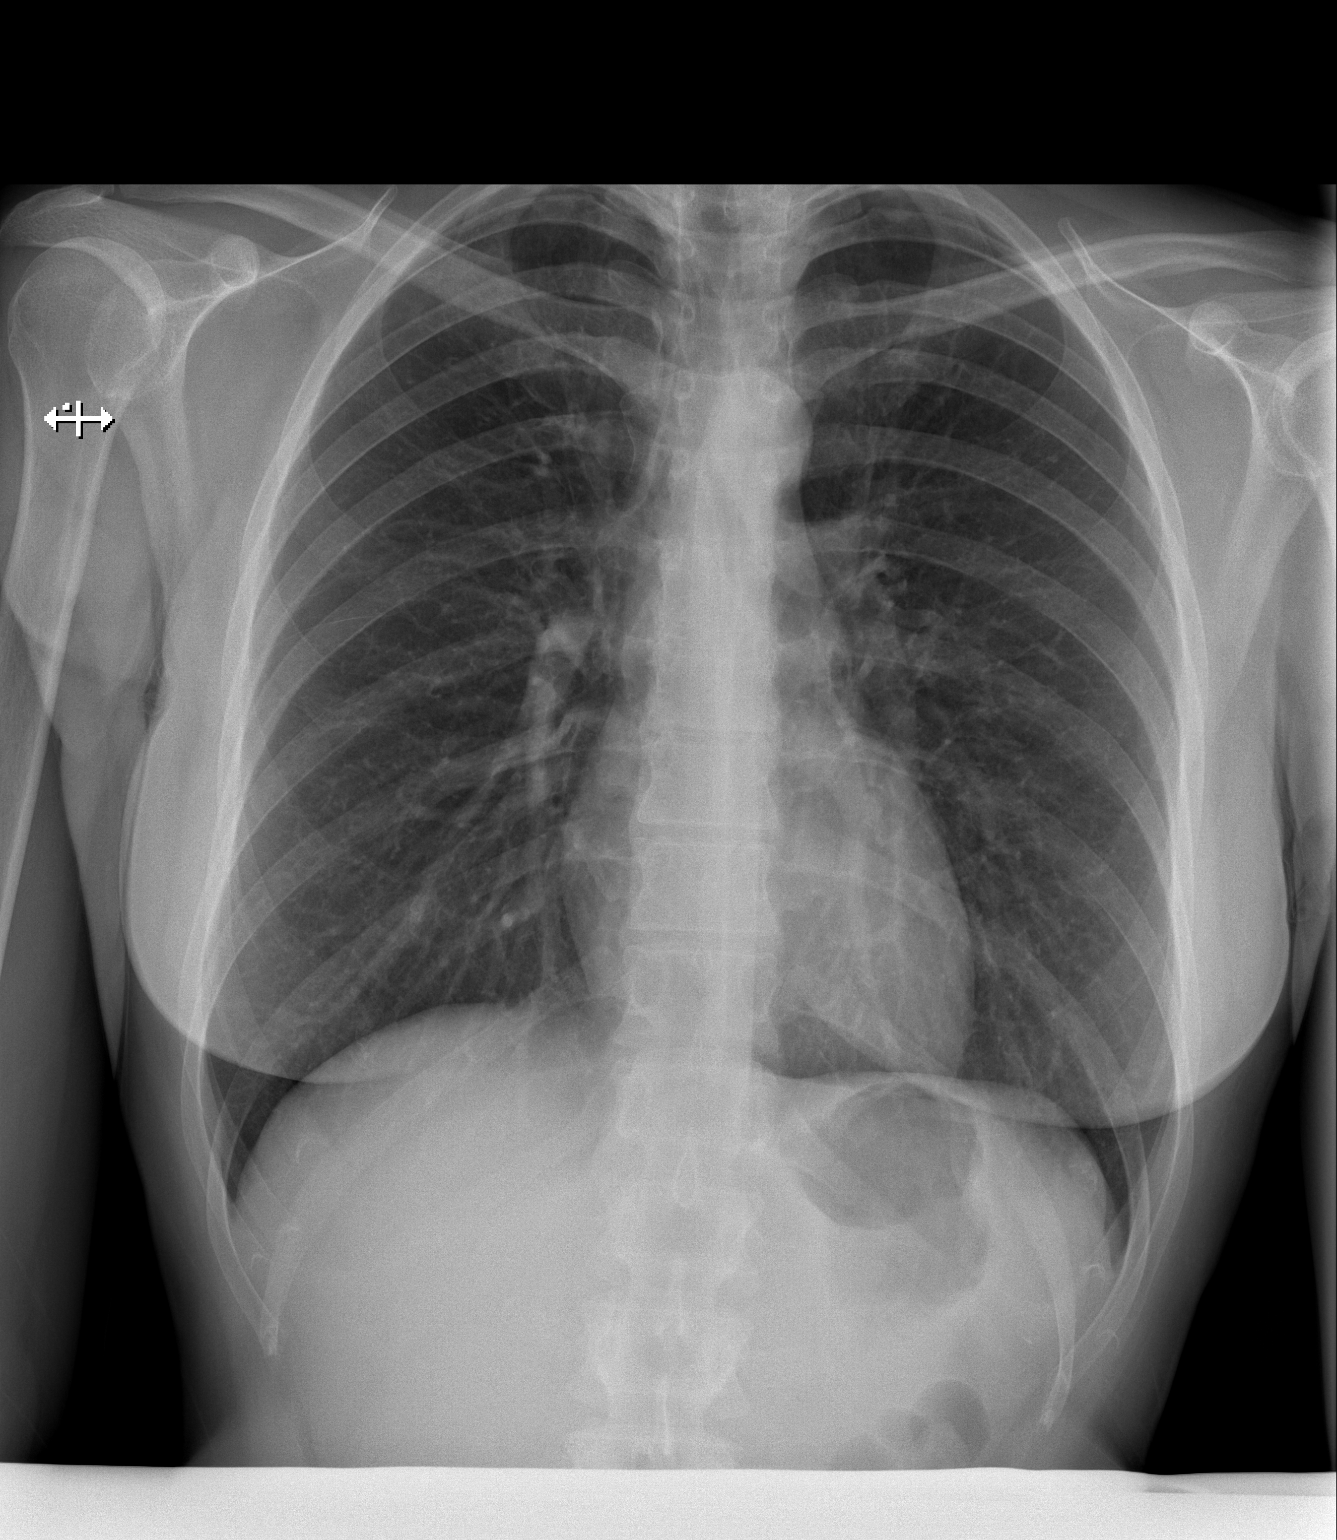

[w chest lat]
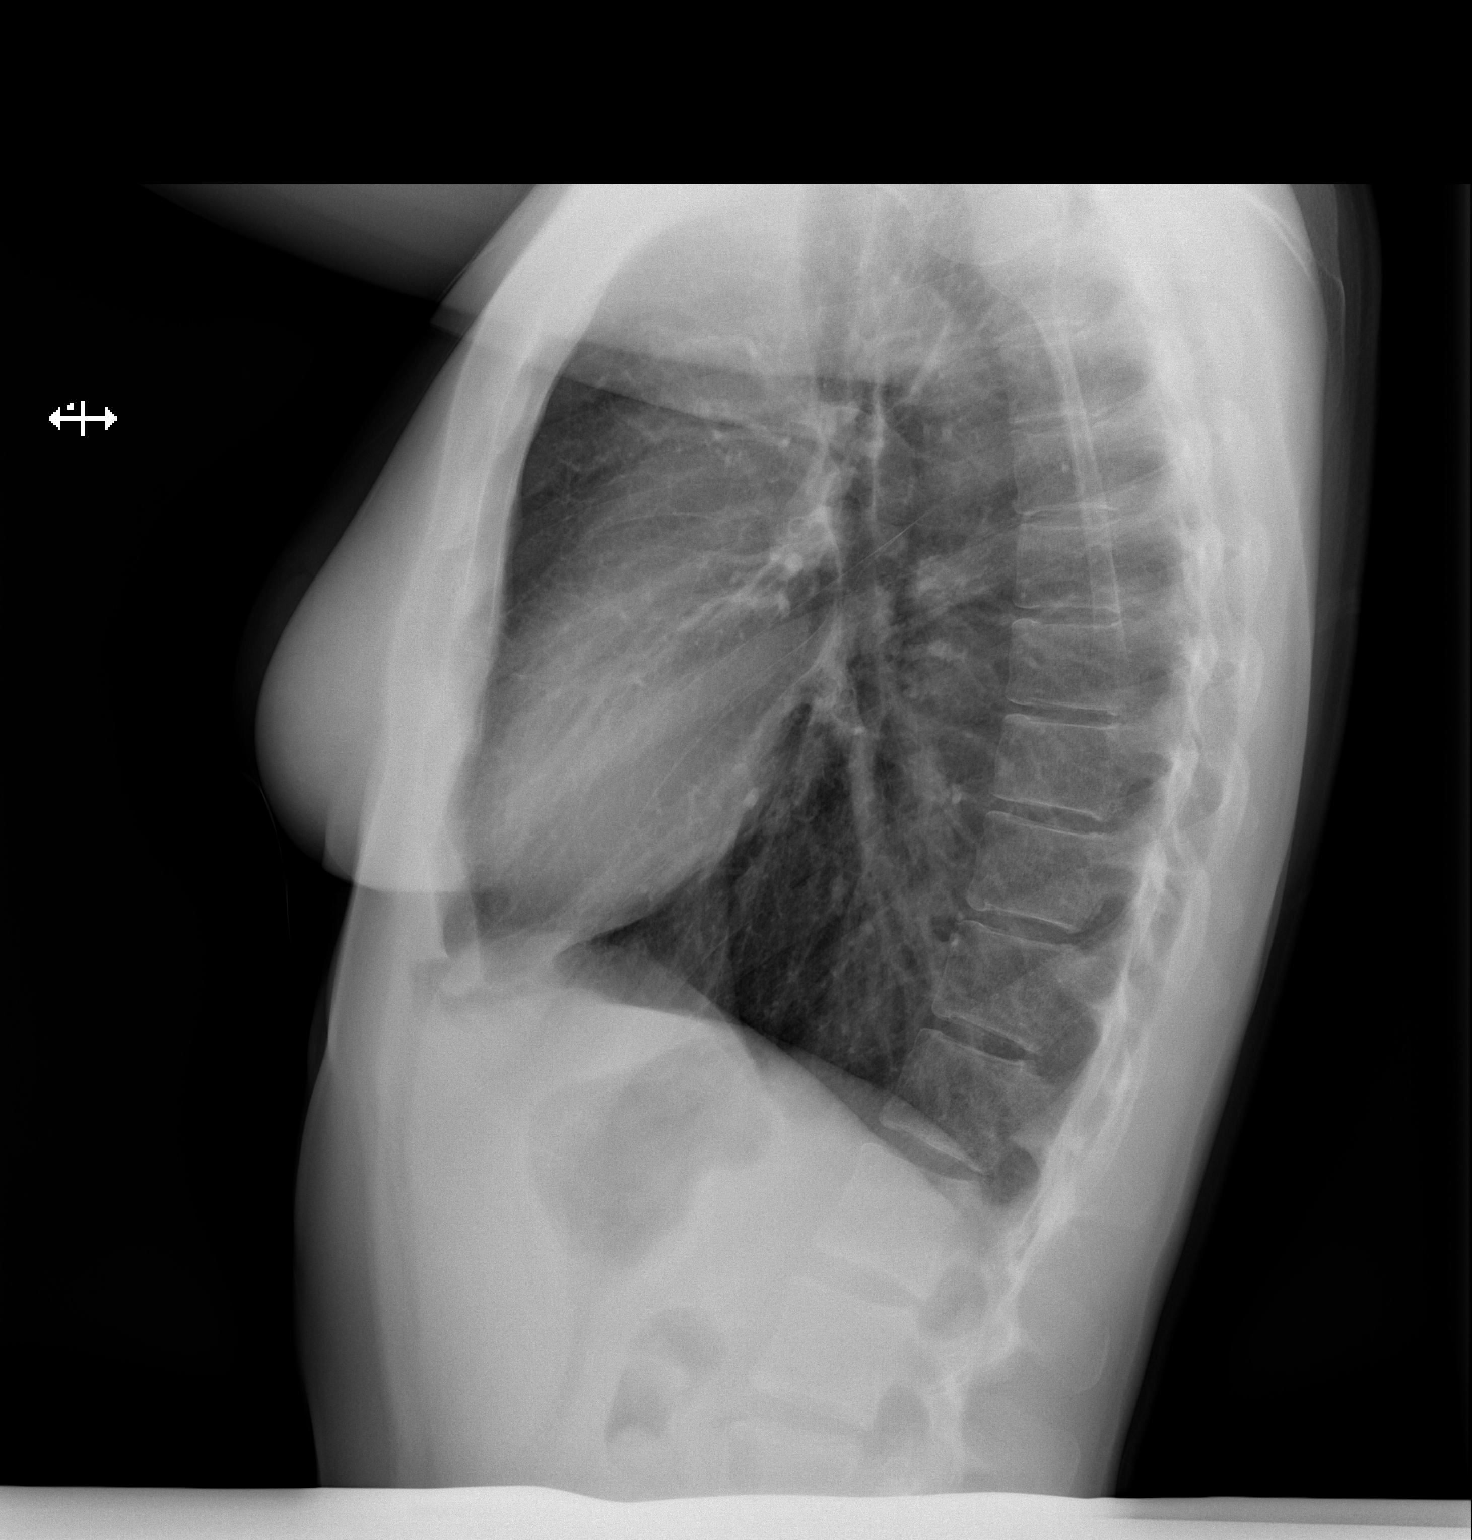

[2 of 2 positions shown; findings below may reference images not displayed]

FINDINGS: The heart size and mediastinal contours are within normal limits.
Both lungs are clear. The visualized skeletal structures are
unremarkable.
IMPRESSION: No active cardiopulmonary disease.

## 2016-11-10 ENCOUNTER — Other Ambulatory Visit (INDEPENDENT_AMBULATORY_CARE_PROVIDER_SITE_OTHER): Payer: Managed Care, Other (non HMO)

## 2016-11-10 DIAGNOSIS — E0500 Thyrotoxicosis with diffuse goiter without thyrotoxic crisis or storm: Secondary | ICD-10-CM | POA: Diagnosis not present

## 2016-11-10 LAB — T4, FREE: FREE T4: 0.67 ng/dL (ref 0.60–1.60)

## 2016-11-10 LAB — TSH: TSH: 0.95 u[IU]/mL (ref 0.35–4.50)

## 2016-11-10 LAB — T3, FREE: T3, Free: 3.1 pg/mL (ref 2.3–4.2)

## 2016-12-10 ENCOUNTER — Other Ambulatory Visit: Payer: Self-pay | Admitting: Family Medicine

## 2016-12-10 DIAGNOSIS — J309 Allergic rhinitis, unspecified: Secondary | ICD-10-CM

## 2016-12-27 ENCOUNTER — Ambulatory Visit: Payer: Self-pay | Admitting: Internal Medicine

## 2017-02-03 ENCOUNTER — Encounter: Payer: Self-pay | Admitting: Family Medicine

## 2017-02-18 ENCOUNTER — Encounter: Payer: Self-pay | Admitting: Internal Medicine

## 2017-02-18 ENCOUNTER — Ambulatory Visit (INDEPENDENT_AMBULATORY_CARE_PROVIDER_SITE_OTHER): Payer: Managed Care, Other (non HMO) | Admitting: Internal Medicine

## 2017-02-18 VITALS — BP 120/74 | HR 89 | Wt 147.0 lb

## 2017-02-18 DIAGNOSIS — E0500 Thyrotoxicosis with diffuse goiter without thyrotoxic crisis or storm: Secondary | ICD-10-CM

## 2017-02-18 LAB — TSH: TSH: 0.23 u[IU]/mL — ABNORMAL LOW (ref 0.35–4.50)

## 2017-02-18 LAB — T3, FREE: T3, Free: 3.6 pg/mL (ref 2.3–4.2)

## 2017-02-18 LAB — T4, FREE: Free T4: 0.94 ng/dL (ref 0.60–1.60)

## 2017-02-18 NOTE — Patient Instructions (Addendum)
Please stop at the lab.  Please continue Methimazole 5 mg 1x a day for now.  Please come back for a follow-up appointment in 6 months.

## 2017-02-18 NOTE — Progress Notes (Signed)
Patient ID: Brianna Mckinney, female   DOB: 09-09-1980, 36 y.o.   MRN: [id number]    HPI  Brianna Mckinney is a 36 y.o.-year-old female, returning for f/u for Graves ds. Last visit 6 mo ago.  She got engaged since last visit. She is contemplating a pregnancy within next few months>> would like to switch from MMI to PTU in advance.  Reviewed hx: She has several stressors in the last year: Her father died, also, she has relationship problems.  TFTs were checked and they returned abnormal.  She had a URI but developed this after the first set of thyroid tests returned abnormal.  I reviewed pt's thyroid tests- 2 latest sets of labs were normal: Lab Results  Component Value Date   TSH 0.95 11/10/2016   TSH 0.37 08/26/2016   TSH 0.06 (L) 07/01/2016   TSH 0.01 (L) 05/28/2016   TSH 0.07 (L) 04/19/2016   TSH 0.17 (L) 04/13/2016   TSH 1.61 06/12/2014   TSH 0.78 01/09/2014   TSH 1.50 10/25/2012   TSH 2.45 07/30/2011   FREET4 0.67 11/10/2016   FREET4 0.61 08/26/2016   FREET4 1.28 07/01/2016   FREET4 1.65 (H) 05/28/2016   FREET4 1.33 04/19/2016    Graves antibodies were elevated: Lab Results  Component Value Date   TSI 399 (H) 04/19/2016   We started MMI 5 mg bid >> then increased to 3x a day in 06/2016. TFTs normalized in 08/2016. We were able to decrease MMI dose back to 5 mg 2x a day in 08/2016 >> TFTs remained normal. She actually decreased the dose to MMI 5 mg daily beginning of 12/2016 b/c weight gain and sluggishness. She felt better afterwards.  She c/o: - + wt gain - + anxiety (chronic) - no palpitations - no tremors - + slight constipation - + cold intolerance - chronic  Pt denies: - feeling nodules in neck - hoarseness - dysphagia - choking - SOB with lying down  Pt does not have a FH of thyroid ds: M aunt and MGM - hypothyroidism.No FH of thyroid cancer. No h/o radiation tx to head or neck.  No seaweed or kelp. No recent contrast studies. No herbal supplements. No  Biotin use. No recent steroids use.   Pt. also has a history of depression/anxiety, HL, colitis - hosp. In 2016.  ROS: Constitutional: + see HPI Eyes: no blurry vision, no xerophthalmia ENT: no sore throat, + see HPI Cardiovascular: no CP/no SOB/no palpitations/no leg swelling Respiratory: + cough/no SOB/no wheezing Gastrointestinal: no N/no V/no D/no C/no acid reflux Musculoskeletal: no muscle aches/no joint aches Skin: no rashes, no hair loss Neurological: no tremors/no numbness/no tingling/no dizziness  I reviewed pt's medications, allergies, PMH, social hx, family hx, and changes were documented in the history of present illness. Otherwise, unchanged from my initial visit note.   Past Medical History:  Diagnosis Date  . Allergy   . Depression   . Hyperlipidemia   . UTI (lower urinary tract infection)    Past Surgical History:  Procedure Laterality Date  . EYE SURGERY     right eye  . EYE SURGERY     LEFT LASER  . REFRACTIVE SURGERY    . WISDOM TOOTH EXTRACTION     Social History   Social History  . Marital status: Single    Spouse name: N/A  . Number of children: 0   Occupational History  . RN    Social History Main Topics  . Smoking status: Never Smoker  .  Smokeless tobacco: Never Used  . Alcohol use Yes     Comment: OCCASIONAL  . Drug use: No  . Sexual activity: Yes    Birth control/ protection: Other-see comments, Inserts     Comment: Nuvaring   Current Outpatient Prescriptions on File Prior to Visit  Medication Sig Dispense Refill  . Armodafinil (NUVIGIL) 150 MG tablet Take 75-150 mg by mouth daily as needed (for alertness).    . fexofenadine (ALLEGRA) 180 MG tablet Take 180 mg by mouth daily.    . folic acid (FOLVITE) 800 MCG tablet Take 400 mcg by mouth daily.    Marland Kitchen LORazepam (ATIVAN) 1 MG tablet Take 1/2-1 tablet by mouth at bedtime prior to sleep.    . methimazole (TAPAZOLE) 5 MG tablet Take 1 tablet (5 mg total) by mouth 2 (two) times daily. 90  tablet 3  . methimazole (TAPAZOLE) 5 MG tablet TAKE 1 TABLET (5 MG TOTAL) BY MOUTH 2 (TWO) TIMES DAILY. 75 tablet 2  . mometasone (NASONEX) 50 MCG/ACT nasal spray USE 2 SPRAYS IN EACH NOSTRIL EVERY DAY 17 g 4  . Multiple Vitamin (MULTIVITAMIN WITH MINERALS) TABS tablet Take 2 tablets by mouth daily.    Marland Kitchen NUVARING 0.12-0.015 MG/24HR vaginal ring INSERT 1 RING VAGINALLY FOR 3 WEEKS AND REMOVE FOR 1 WEEK 3 each 3  . Olopatadine HCl (PAZEO) 0.7 % SOLN Apply to eye. Apply 1 drop to both eyes daily.    . sertraline (ZOLOFT) 50 MG tablet TAKE 1 TABLET BY MOUTH DAILY. 30 tablet 6  . triamcinolone cream (KENALOG) 0.1 % Apply 1 application topically 2 (two) times daily. (Patient taking differently: Apply 1 application topically 2 (two) times daily as needed (for ezcema.). ) 30 g 0   No current facility-administered medications on file prior to visit.    No Known Allergies Family History  Problem Relation Age of Onset  . Sudden death Mother        suicide  . Depression Mother   . Arthritis Father        RA   Also: - Diabetes in MGM - HTN in father - HL in mother, aunts, grandmother, father - Heart disease in MGM - Cancer in PGM  PE: BP 120/74 (BP Location: Left Arm, Patient Position: Sitting)   Pulse 89   Wt 147 lb (66.7 kg)   LMP 02/14/2017   SpO2 98%   BMI 24.09 kg/m  Wt Readings from Last 3 Encounters:  02/18/17 147 lb (66.7 kg)  08/26/16 134 lb (60.8 kg)  05/28/16 127 lb (57.6 kg)   Constitutional: normal weight, in NAD Eyes: PERRLA, EOMI, no exophthalmos, no lid lag, no stare ENT: moist mucous membranes, no thyromegaly, +L upper cervical enlarged lymph node Cardiovascular: RRR, No MRG Respiratory: CTA B Gastrointestinal: abdomen soft, NT, ND, BS+ Musculoskeletal: no deformities, strength intact in all 4 Skin: moist, warm, no rashes Neurological: no tremor with outstretched hands, DTR normal in all 4  ASSESSMENT: 1. Graves ds.  PLAN:  1. Patient with h/o Graves ds, well  responding to MMI low dose >> with normalization of TFTs on MMI 5 mg 2x a day earlier this year >> she decreased the dose to 1x a day ~2 mo ago b/c of weight gain and fatigue. She started to feel better afterwards but is curious whether her hyperthyroidism has exacerbated. - we reviewd together her TSI Ab level from 04/2016 >> 399 - will recheck a new level today to check Graves ds activity. - will check  TFTs today, but when her results are back, we need to switch to PTU as she is contemplating a pregnancy and we discussed about the preference for PTU in the first trimester pregnancy. I would want to switch her to PTU earlier and make sure that we get normal thyroid tests before she actually starts trying to get pregnant due to her age. - We could not use beta blockers in the past despite tachycardia and anxiety because of her very low blood pressure. At this visit, her pulse is not high. I advised her to let me know if the pulse becomes higher than 90 at rest. - RTC in 6months, but likely sooner for repeat labs  Needs refills - PTU  Component     Latest Ref Rng & Units 02/18/2017  TSH     0.35 - 4.50 uIU/mL 0.23 (L)  T4,Free(Direct)     0.60 - 1.60 ng/dL 1.61  Triiodothyronine,Free,Serum     2.3 - 4.2 pg/mL 3.6  TSI     <140 % baseline 187 (H)   TSI's have decreased. TSH lower >> needs an increase in dose >> will change to PTU 100 mg 2x a day >> recheck labs in 1.5 mo.  Carlus Pavlov, MD PhD [facility] Endocrinology

## 2017-02-22 LAB — THYROID STIMULATING IMMUNOGLOBULIN: TSI: 187 % baseline — ABNORMAL HIGH (ref <140)

## 2017-02-22 MED ORDER — PROPYLTHIOURACIL 50 MG PO TABS: 100.0000 mg | ORAL_TABLET | Freq: Two times a day (BID) | ORAL | 2 refills | Status: DC

## 2017-04-11 ENCOUNTER — Other Ambulatory Visit: Payer: Self-pay

## 2017-04-20 ENCOUNTER — Encounter: Payer: Self-pay | Admitting: Family Medicine

## 2017-05-05 ENCOUNTER — Other Ambulatory Visit: Payer: Self-pay | Admitting: Internal Medicine

## 2017-05-05 ENCOUNTER — Other Ambulatory Visit (INDEPENDENT_AMBULATORY_CARE_PROVIDER_SITE_OTHER): Payer: Managed Care, Other (non HMO)

## 2017-05-05 DIAGNOSIS — E0500 Thyrotoxicosis with diffuse goiter without thyrotoxic crisis or storm: Secondary | ICD-10-CM

## 2017-05-05 LAB — T4, FREE: FREE T4: 0.78 ng/dL (ref 0.60–1.60)

## 2017-05-05 LAB — T3, FREE: T3 FREE: 3.7 pg/mL (ref 2.3–4.2)

## 2017-05-05 LAB — TSH: TSH: 0.27 u[IU]/mL — ABNORMAL LOW (ref 0.35–4.50)

## 2017-05-06 ENCOUNTER — Other Ambulatory Visit: Payer: Self-pay | Admitting: *Deleted

## 2017-05-06 DIAGNOSIS — J309 Allergic rhinitis, unspecified: Secondary | ICD-10-CM

## 2017-05-06 MED ORDER — MOMETASONE FUROATE 50 MCG/ACT NA SUSP: NASAL | 4 refills | Status: AC

## 2017-07-16 ENCOUNTER — Other Ambulatory Visit: Payer: Self-pay | Admitting: Internal Medicine

## 2017-08-19 ENCOUNTER — Telehealth: Payer: Self-pay | Admitting: Internal Medicine

## 2017-08-19 ENCOUNTER — Ambulatory Visit: Payer: Self-pay | Admitting: Internal Medicine

## 2017-08-19 NOTE — Telephone Encounter (Signed)
Patient moved to [place] and [place] to [facility] [place] ph# [phone]

## 2017-08-22 NOTE — Telephone Encounter (Signed)
Called pt and informed referral would need to come from PCP. Patient verbalized understanding.

## 2018-05-30 ENCOUNTER — Other Ambulatory Visit: Payer: Self-pay | Admitting: Internal Medicine
